# Patient Record
Sex: Female | Born: 1980 | Race: Black or African American | Hispanic: No | Marital: Married | Smoking: Never smoker
Health system: Southern US, Community
[De-identification: ages and names within clinical notes are randomized; demographics above are authoritative.]

## PROBLEM LIST (undated history)

## (undated) DIAGNOSIS — G971 Other reaction to spinal and lumbar puncture: Secondary | ICD-10-CM

## (undated) DIAGNOSIS — J45909 Unspecified asthma, uncomplicated: Secondary | ICD-10-CM

## (undated) HISTORY — PX: CERVICAL CERCLAGE: SHX1329

## (undated) HISTORY — PX: TONSILLECTOMY: SUR1361

## (undated) HISTORY — PX: LEEP: SHX91

---

## 2004-08-11 ENCOUNTER — Ambulatory Visit (HOSPITAL_COMMUNITY): Admission: RE | Admit: 2004-08-11 | Discharge: 2004-08-11 | Payer: Self-pay | Admitting: *Deleted

## 2005-04-17 ENCOUNTER — Inpatient Hospital Stay (HOSPITAL_COMMUNITY): Admission: AD | Admit: 2005-04-17 | Discharge: 2005-04-17 | Payer: Self-pay | Admitting: Obstetrics and Gynecology

## 2005-12-16 IMAGING — RF DG HYSTEROGRAM
4 series · 4 of 4 positions shown · non-contrast
Comparison: none

CLINICAL DATA: Secondary infertility.  Previous miscarriages.  
 HYSTEROSALPINGOGRAM:
 Hysterosalpingogram was performed by Dr. Lorraine, and four fluoroscopic spot images were submitted for interpretation.  
 The endometrial cavity of the uterus is unremarkable in appearance.  Contrast opacification of both fallopian tubes is seen and both tubes are normal in appearance.  Intraperitoneal spill of contrast from both tubes is seen.

[Series 1: run · 1 of 1 slices shown (1 of 4)]
[im 1/1]
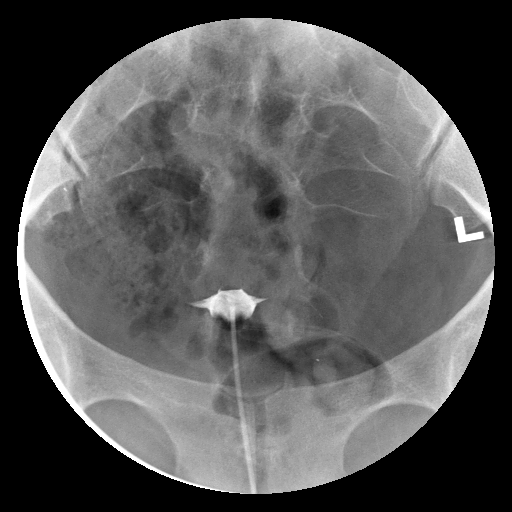

[Series 2: run · 1 of 1 slices shown (2 of 4)]
[im 1/1]
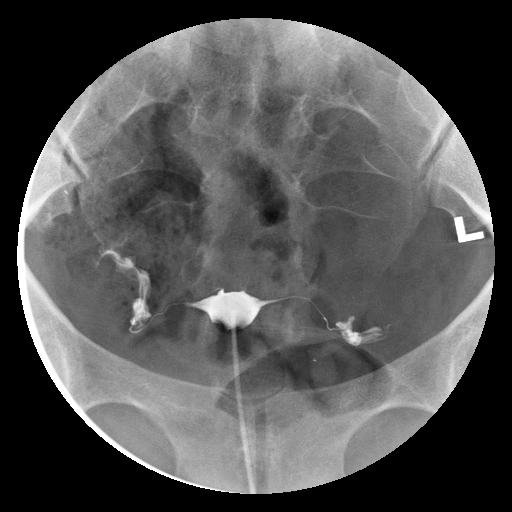

[Series 3: run · 1 of 1 slices shown (3 of 4)]
[im 1/1]
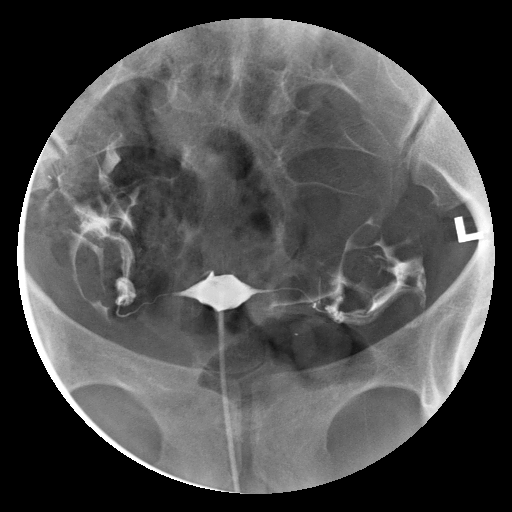

[Series 4: run · 1 of 1 slices shown (4 of 4)]
[im 1/1]
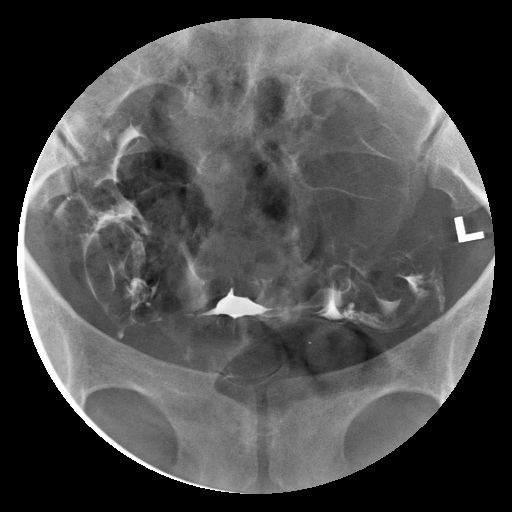

[4 of 4 positions shown; findings below may reference images not displayed]

IMPRESSION: Both fallopian tubes are patent.  Unremarkable appearance of the endometrial cavity.

## 2006-05-24 ENCOUNTER — Other Ambulatory Visit: Admission: RE | Admit: 2006-05-24 | Discharge: 2006-05-24 | Payer: Self-pay | Admitting: Obstetrics and Gynecology

## 2006-06-03 ENCOUNTER — Inpatient Hospital Stay (HOSPITAL_COMMUNITY): Admission: AD | Admit: 2006-06-03 | Discharge: 2006-06-03 | Payer: Self-pay | Admitting: Obstetrics and Gynecology

## 2006-06-06 ENCOUNTER — Ambulatory Visit (HOSPITAL_COMMUNITY): Admission: RE | Admit: 2006-06-06 | Discharge: 2006-06-06 | Payer: Self-pay | Admitting: Obstetrics and Gynecology

## 2006-06-06 ENCOUNTER — Encounter (INDEPENDENT_AMBULATORY_CARE_PROVIDER_SITE_OTHER): Payer: Self-pay | Admitting: *Deleted

## 2006-08-22 IMAGING — US US OB TRANSVAGINAL MODIFY
1 series · 13 of 28 positions shown · non-contrast
Comparison: None.

CLINICAL INFORMATION: 24-year-old G2 P0 AB1, LMP 02/07/2005, presenting with
spotting and cramping.

EARLY OBSTETRICAL ULTRASOUND (LESS THAN 14 WEEKS) INCLUDING TRANSVAGINAL
EXAMINATION  04/17/2005:

[Series 1: us ob transvaginal modify · 0.29mm/px · 13 of 31 slices shown]
[im 2/31]
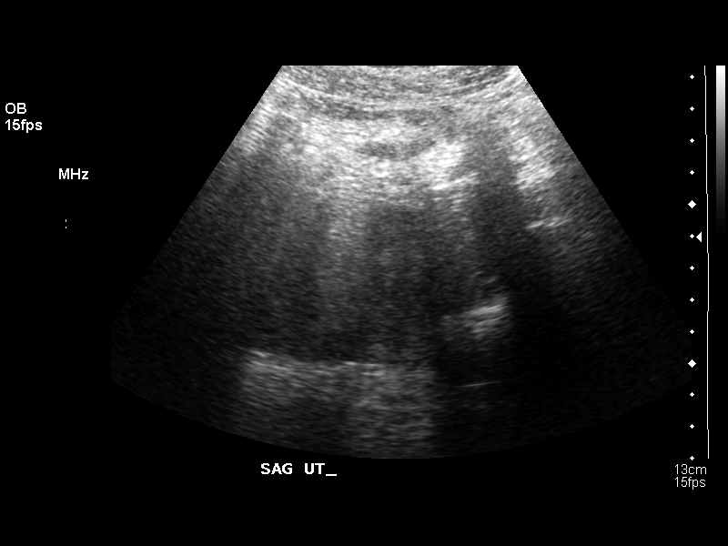
[im 4/31]
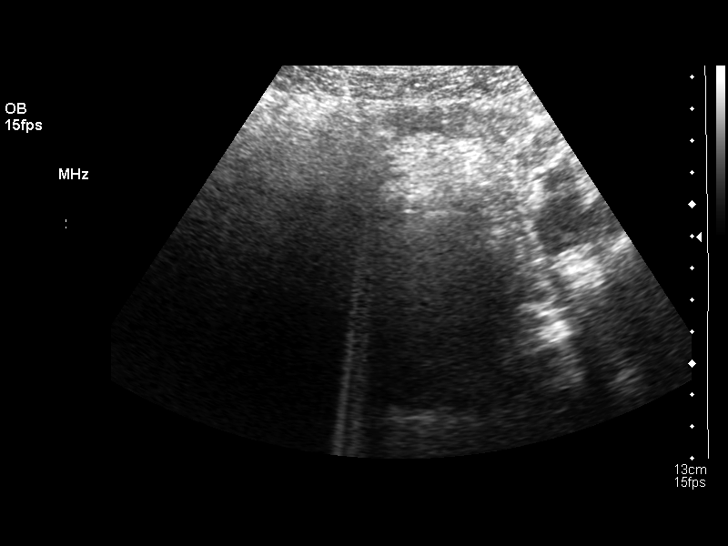
[im 6/31]
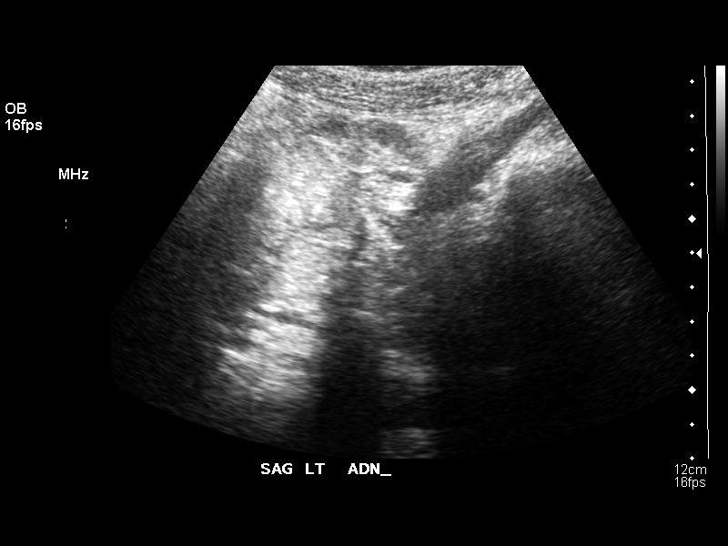
[im 8/31]
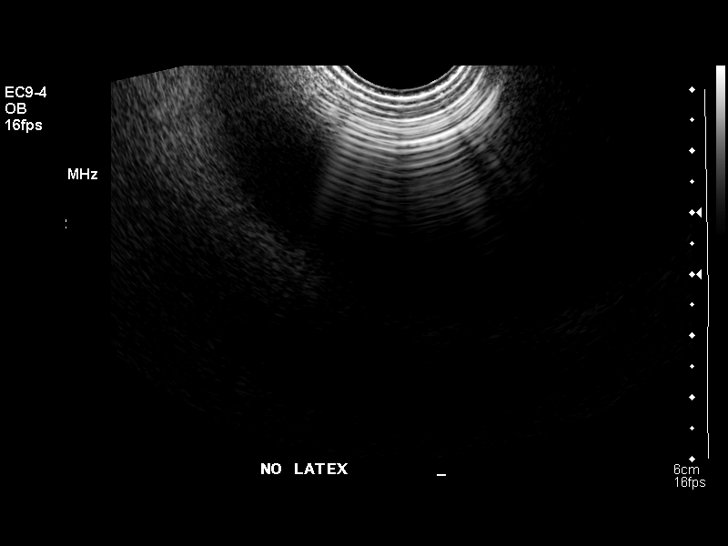
[im 11/31]
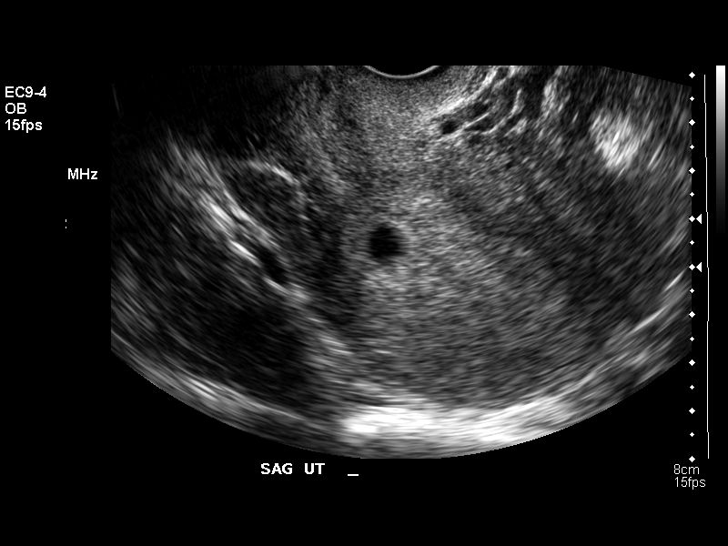
[im 13/31]
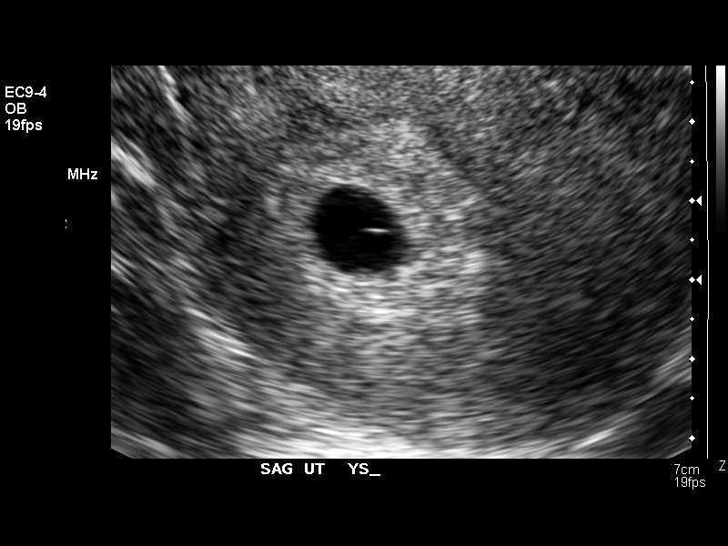
[im 16/31]
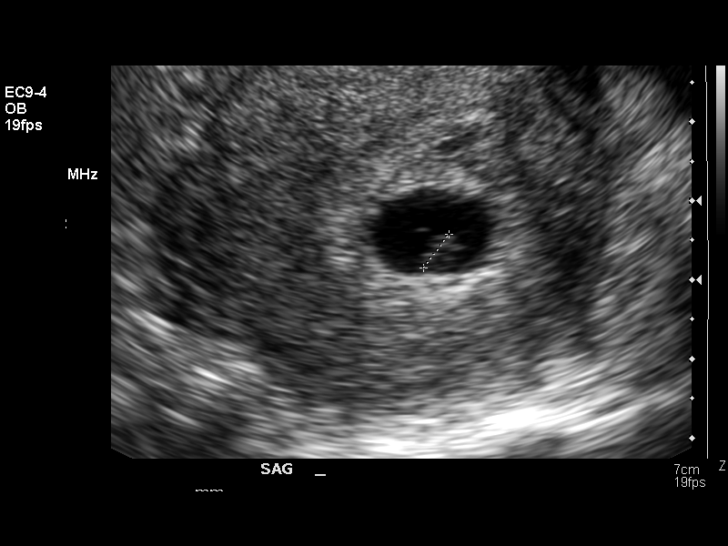
[im 18/31]
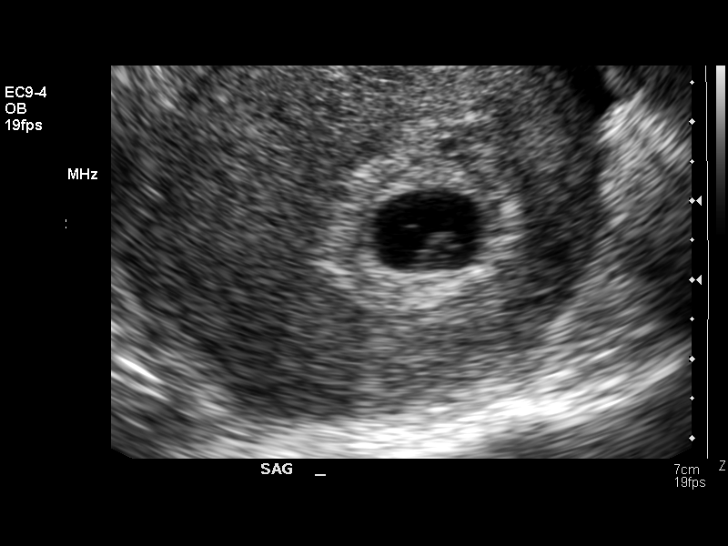
[im 21/31]
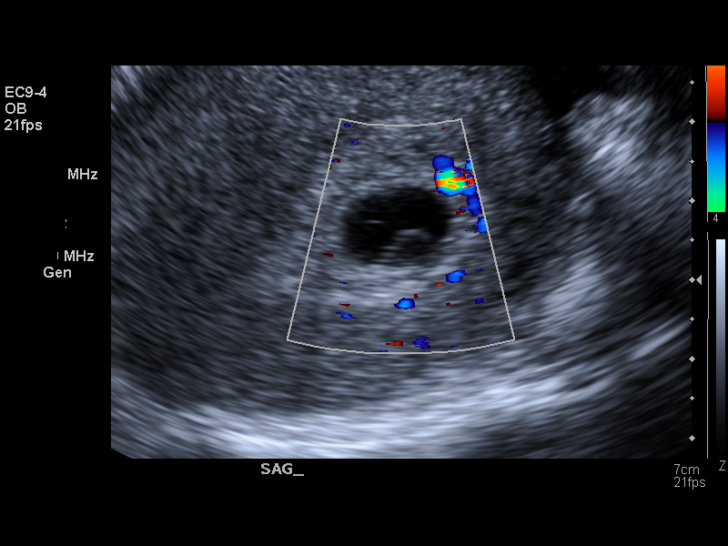
[im 23/31]
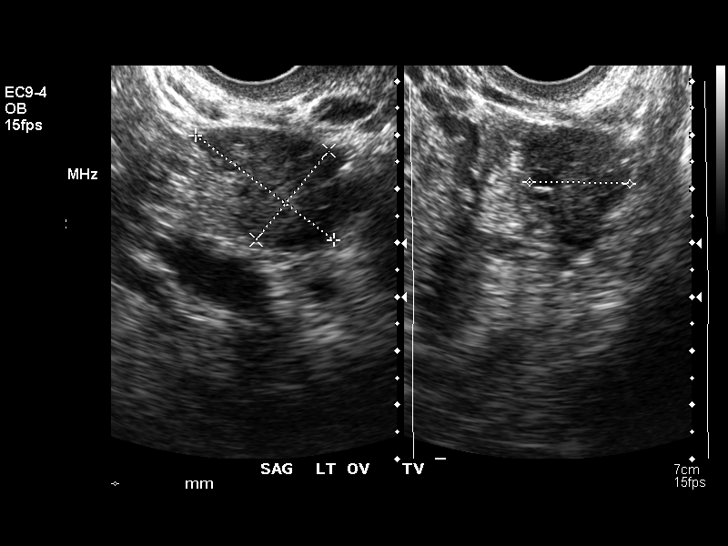
[im 25/31]
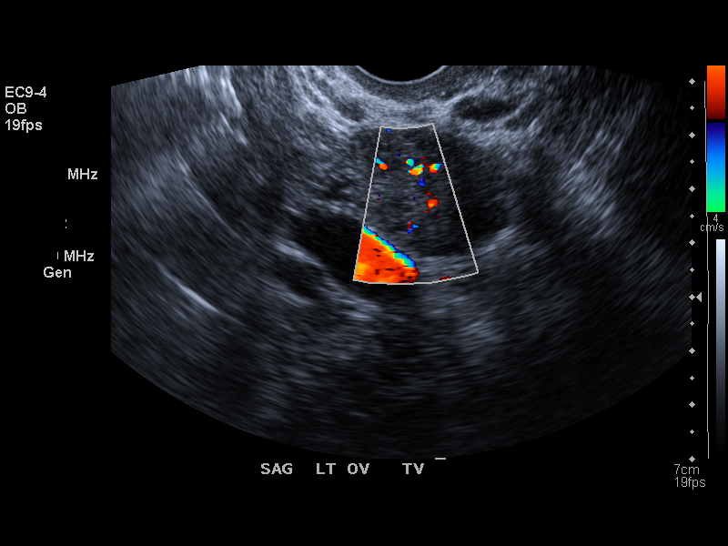
[im 27/31]
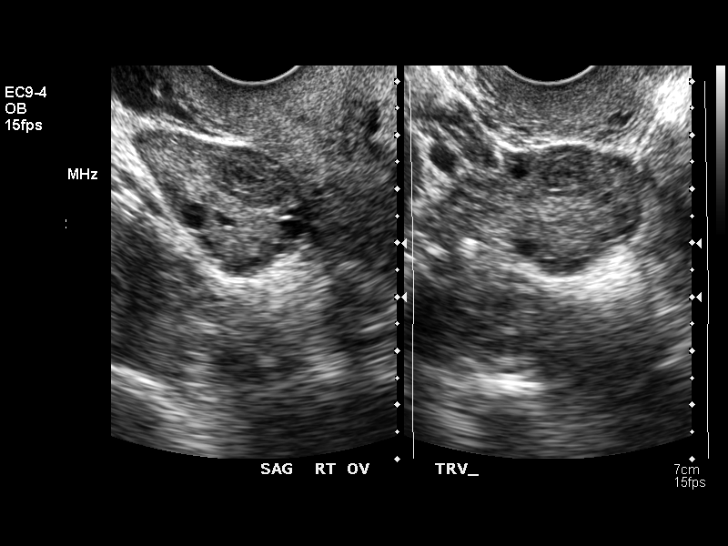
[im 29/31]
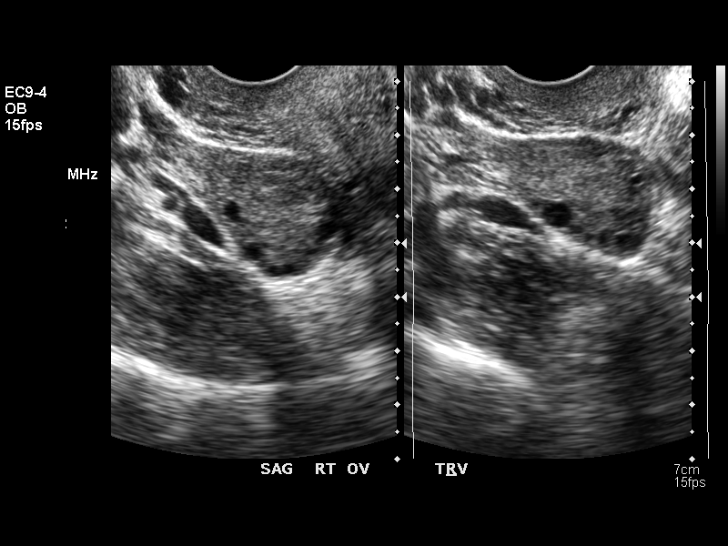

[13 of 28 positions shown; findings below may reference images not displayed]

By report, the patient had an ultrasound 10 days ago at
which time the embryo measured approximately 6 weeks and had a heart rate of
only 50.
FINDINGS: There is an intrauterine embryo with a crown-rump length of 5.3 mm,
corresponding to a gestational age of 6 weeks 2 days. This is far behind the
estimated gestational age by LMP of 9 weeks 6 days. A yolk sac is present.
Embryonic cardiac activity was not identified at real time examination. No
detectable heartbeat was seen at color Doppler evaluation. There is no evidence
of subchorionic hemorrhage.

Both ovaries are normal in size and appearance, containing small follicles. The
right ovary measures 3.0 x 3.6 x 2.2 cm. The left ovary measures 1.9 x 3.2 x
cm. There are no adnexal masses. There is no free fluid.
IMPRESSION: 1. Single intrauterine embryo which does not appear viable, as no cardiac
activity was identified. Crown-rump length measured 5.3 mm, corresponding to a
gestational age of 6 weeks 2 days, far behind the gestational age by LMP of 9
weeks 6 days. (One would be expected to identify cardiac activity once the
embryo reaches a crown-rump length of 5 mm).
2. Normal appearing ovaries. No adnexal masses or free fluid.
3. No evidence of subchorionic hemorrhage.

## 2007-05-03 ENCOUNTER — Ambulatory Visit (HOSPITAL_COMMUNITY): Admission: RE | Admit: 2007-05-03 | Discharge: 2007-05-03 | Payer: Self-pay | Admitting: Obstetrics and Gynecology

## 2007-06-04 ENCOUNTER — Inpatient Hospital Stay (HOSPITAL_COMMUNITY): Admission: AD | Admit: 2007-06-04 | Discharge: 2007-06-04 | Payer: Self-pay | Admitting: Obstetrics and Gynecology

## 2007-06-05 ENCOUNTER — Inpatient Hospital Stay (HOSPITAL_COMMUNITY): Admission: AD | Admit: 2007-06-05 | Discharge: 2007-06-05 | Payer: Self-pay | Admitting: Obstetrics and Gynecology

## 2007-09-14 ENCOUNTER — Inpatient Hospital Stay (HOSPITAL_COMMUNITY): Admission: AD | Admit: 2007-09-14 | Discharge: 2007-09-14 | Payer: Self-pay | Admitting: Obstetrics and Gynecology

## 2007-09-14 ENCOUNTER — Inpatient Hospital Stay (HOSPITAL_COMMUNITY): Admission: AD | Admit: 2007-09-14 | Discharge: 2007-09-17 | Payer: Self-pay | Admitting: Obstetrics and Gynecology

## 2007-10-08 IMAGING — US US OB TRANSVAGINAL MODIFY
1 series · 14 of 28 positions shown · non-contrast
Comparison: none

CLINICAL DATA: 25-year-old female with positive pregnancy test with pelvic pain, spotting, and cramping.  Estimated gestation of 7 weeks by LMP.  
OBSTETRICAL ULTRASOUND <14 WKS AND TRANSVAGINAL OB US:
TECHNIQUE: Both transabdominal and transvaginal ultrasound examinations were performed for complete evaluation of the gestation as well as the maternal uterus, adnexal regions, and pelvic cul-de-sac.

[Series 1: us ob transvaginal modify · 0.24mm/px · 14 of 43 slices shown]
[im 2/43]
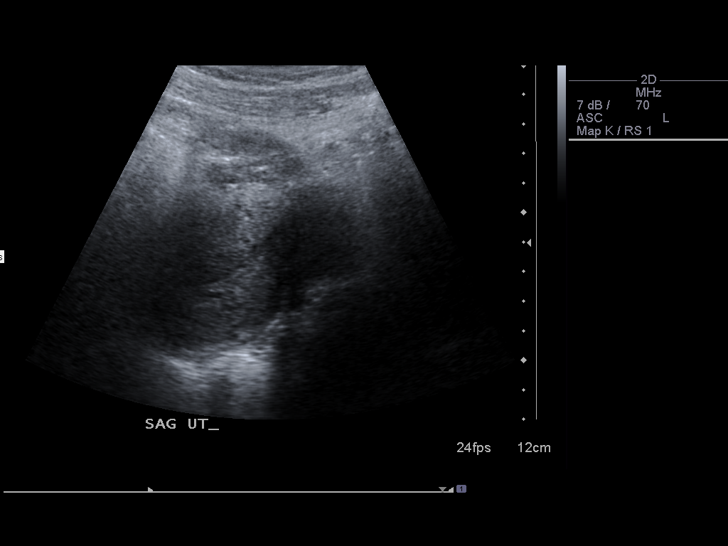
[im 5/43]
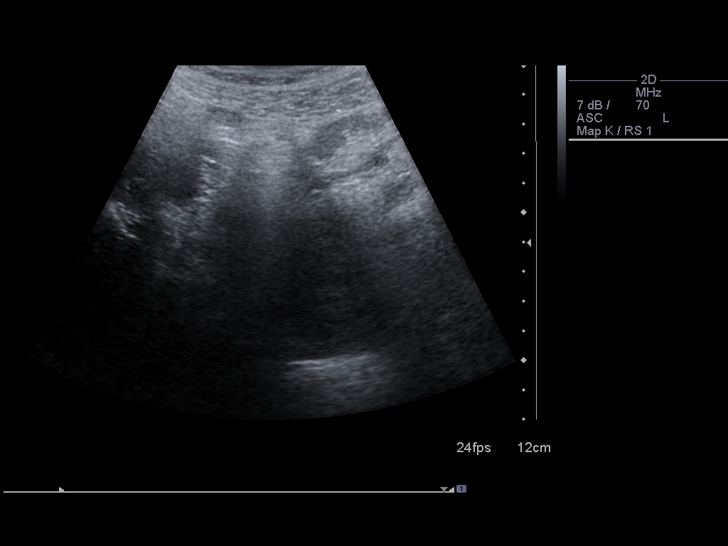
[im 8/43]
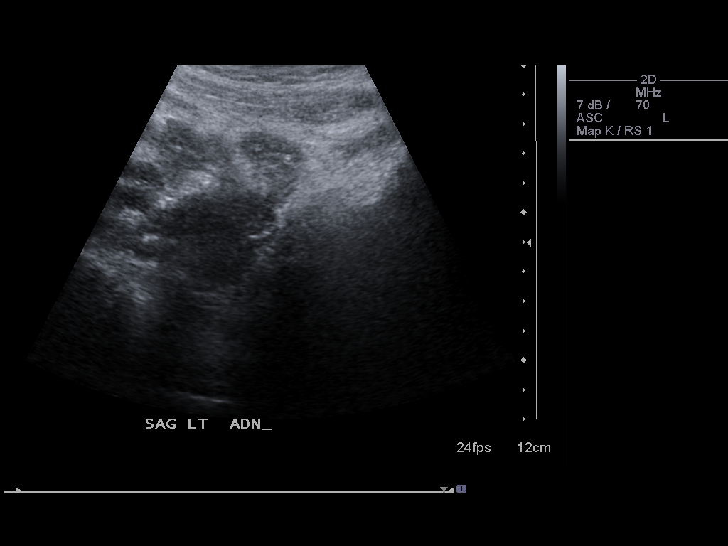
[im 11/43]
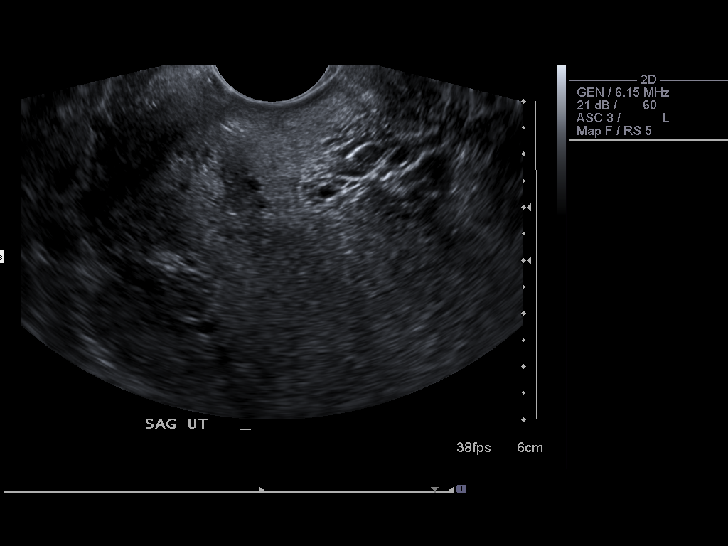
[im 15/43]
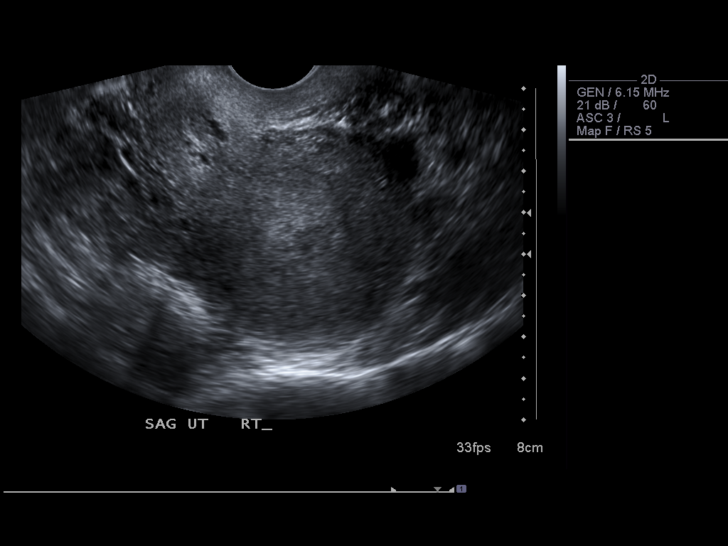
[im 18/43]
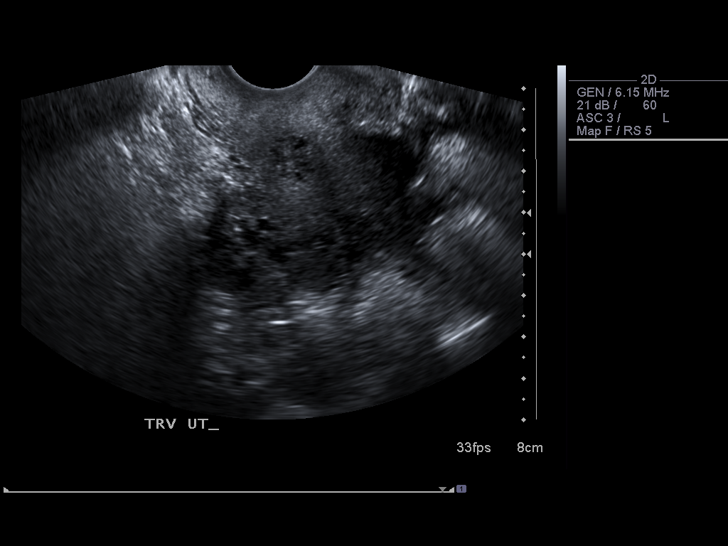
[im 21/43]
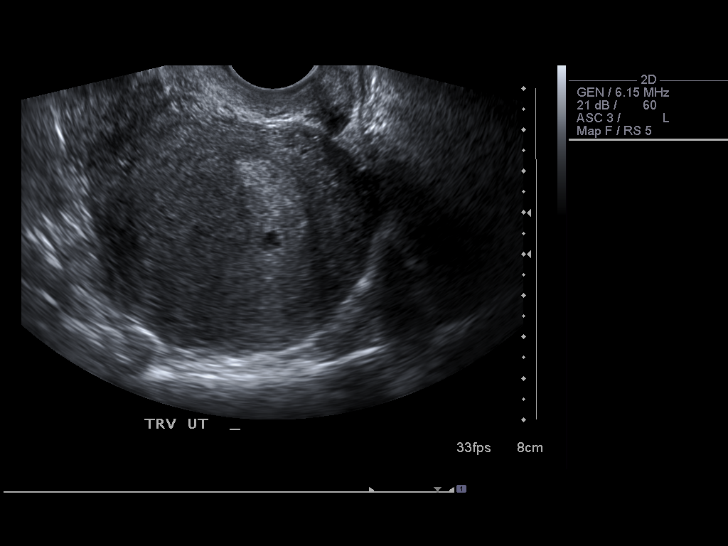
[im 24/43]
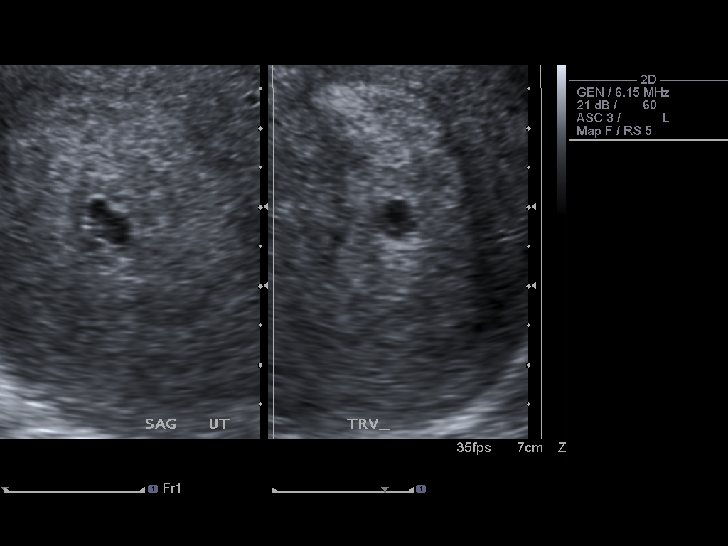
[im 27/43]
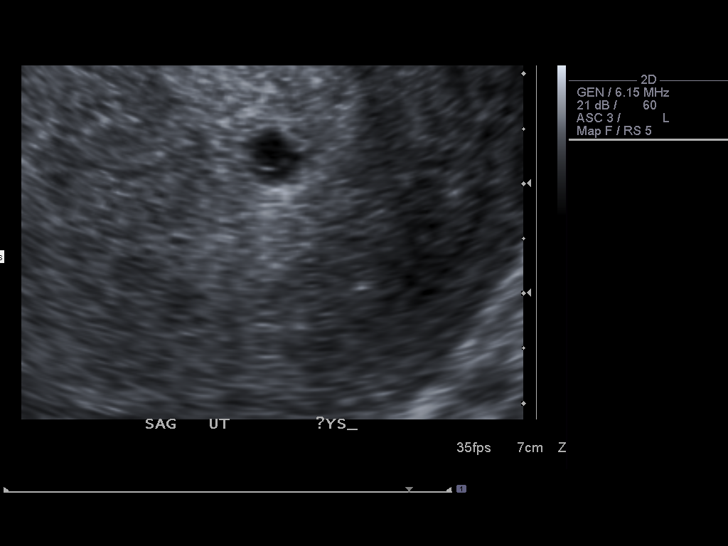
[im 30/43]
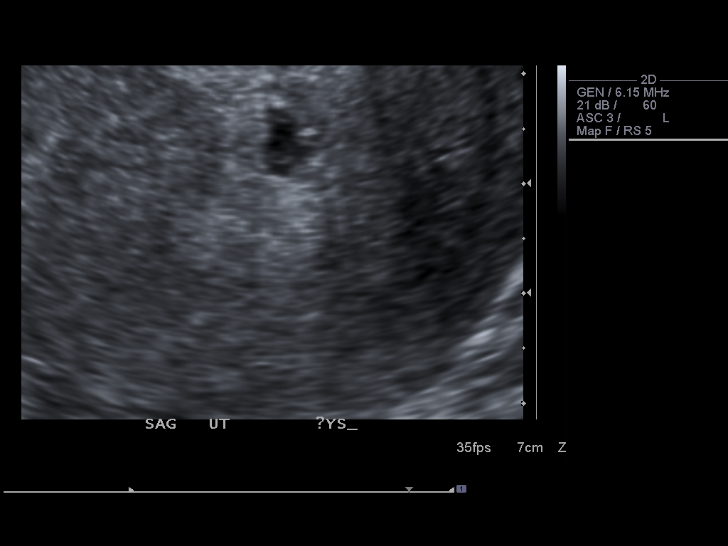
[im 33/43]
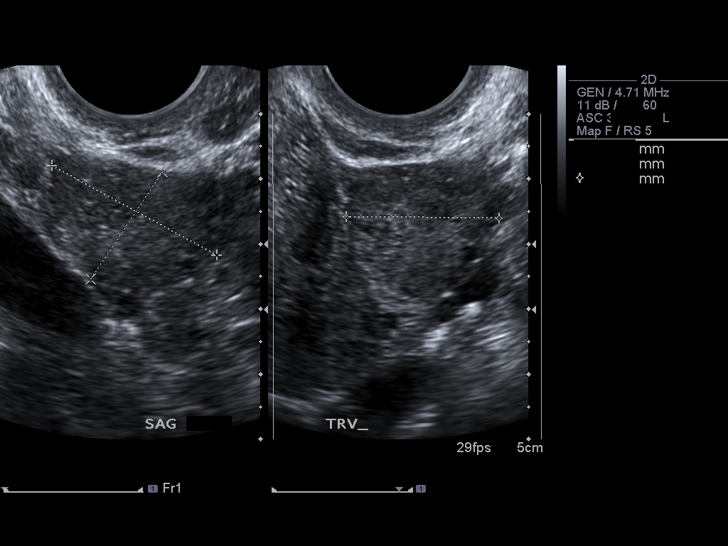
[im 36/43]
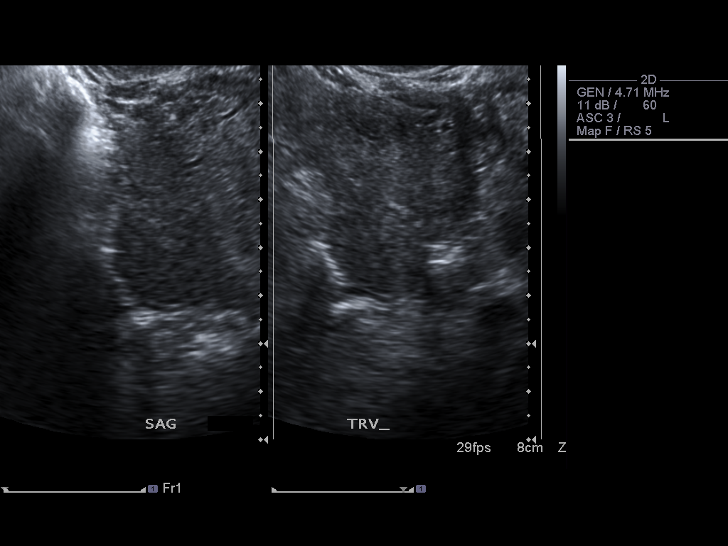
[im 39/43]
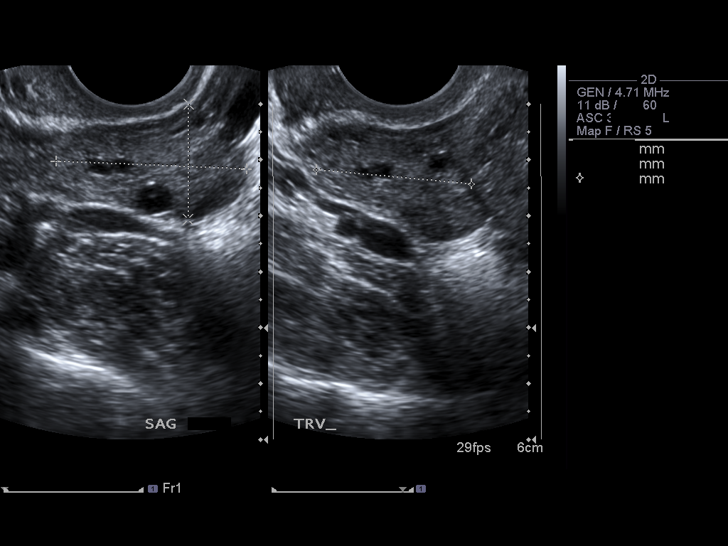
[im 43/43]
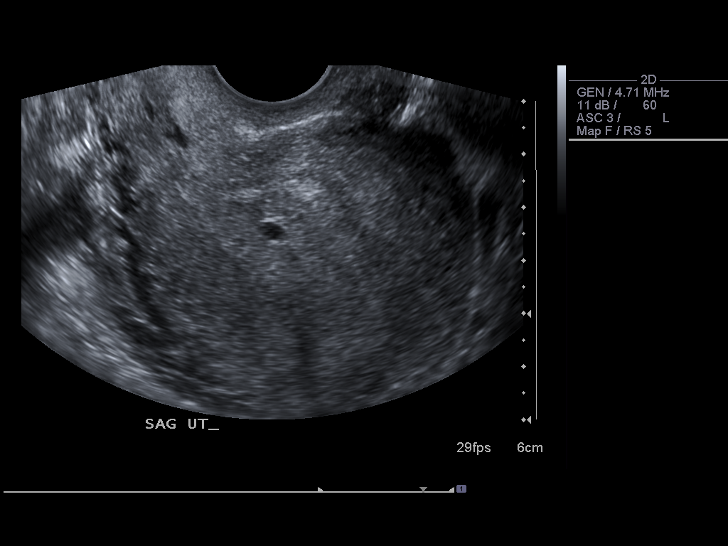

[14 of 28 positions shown; findings below may reference images not displayed]

FINDINGS: There is a single intrauterine gestational sac, which is slightly irregular with a yolk sac present.  Tiger sac is abnormally large for the size of the gestational sac.  There is no evidence of fetal pole.  Little decidual reaction is identified.  Mean sac diameter measures 5.4 mm, corresponding to a gestational age of 5 weeks 2 days.  There is no evidence of subchorionic hemorrhage.  The ovaries bilaterally are unremarkable.  Trace amount of free fluid is identified.
IMPRESSION: Single intrauterine gestational sac with yolk sac.  However, irregular shape of gestational sac, poor decidual reaction, and abnormally large yolk sac in relation to gestational sac size suggests failed first trimester pregnancy.  Follow-up is recommended.

## 2010-06-20 DIAGNOSIS — G971 Other reaction to spinal and lumbar puncture: Secondary | ICD-10-CM

## 2010-06-20 HISTORY — DX: Other reaction to spinal and lumbar puncture: G97.1

## 2010-06-23 ENCOUNTER — Ambulatory Visit (HOSPITAL_COMMUNITY)
Admission: RE | Admit: 2010-06-23 | Discharge: 2010-06-23 | Payer: Self-pay | Source: Home / Self Care | Attending: Obstetrics and Gynecology | Admitting: Obstetrics and Gynecology

## 2010-08-30 LAB — CBC
HCT: 35.2 % — ABNORMAL LOW (ref 36.0–46.0)
MCHC: 34.1 g/dL (ref 30.0–36.0)
MCV: 95.1 fL (ref 78.0–100.0)
RDW: 12 % (ref 11.5–15.5)
WBC: 11.2 10*3/uL — ABNORMAL HIGH (ref 4.0–10.5)

## 2010-10-21 ENCOUNTER — Inpatient Hospital Stay (HOSPITAL_COMMUNITY)
Admission: AD | Admit: 2010-10-21 | Discharge: 2010-10-21 | Disposition: A | Discharge status: Home / Self Care | Payer: Medicaid Other | Source: Ambulatory Visit | Attending: Obstetrics and Gynecology | Admitting: Obstetrics and Gynecology

## 2010-10-21 DIAGNOSIS — O4700 False labor before 37 completed weeks of gestation, unspecified trimester: Secondary | ICD-10-CM

## 2010-10-21 LAB — URINALYSIS, ROUTINE W REFLEX MICROSCOPIC
Bilirubin Urine: NEGATIVE
Hgb urine dipstick: NEGATIVE
Nitrite: NEGATIVE
Specific Gravity, Urine: <1.005 — ABNORMAL LOW (ref 1.005–1.030)
Urobilinogen, UA: 0.2 mg/dL (ref 0.0–1.0)
pH: 6 (ref 5.0–8.0)

## 2010-11-02 NOTE — H&P (Signed)
Martha Hubbard, Martha Hubbard                ACCOUNT NO.:  [id number]   MEDICAL RECORD NO.:  [id number]          PATIENT TYPE:  AMB   LOCATION:  SDC                           [facility]ILITY:  [facility]   PHYSICIAN:  Zenaida Niece, M.D.DATE OF BIRTH:  1981/04/28   DATE OF ADMISSION:  05/03/2007  DATE OF DISCHARGE:                              HISTORY & PHYSICAL   CHIEF COMPLAINT:  Intrauterine pregnancy at 19 weeks with a short  cervix.   HISTORY OF PRESENT ILLNESS:  This is a 30 year old black female, gravida  4, para 0, 1-2-0, with an EGA of [redacted] weeks by a seven week ultrasound  with a due date of April 4th, who was seen in the office on November  10th for her 19-week anatomy ultrasound.  Fetal anatomy was normal  except for an echogenic intra-cardiac focus, but her cervix was noted to  be approximately 1 cm long.  The situation was discussed with the  patient, that she may have an incompetent cervix, and she was given  options of bedrest or proceeding with a cerclage.  The risks of cerclage  have been discussed.  She has been receiving 17 progesterone injections  since 17 weeks due to a significantly pre-term delivery.  After  discussing options, she wishes to proceed with cervical cerclage and is  being admitted for this at this time.   PAST OB HISTORY:  In 2001, she had a vaginal delivery at 20 weeks of a  baby that weighed 10 ounces.  She had pre-term rupture of membranes  rapidly followed by delivery.  She also has two spontaneous abortions at  8 and 9 weeks with one D&C.   PAST GYN HISTORY:  She has a history of a LEEP in 1996 with normal  follow-up Pap smears.  She also has a history of Chlamydia and herpes.   PAST MEDICAL HISTORY:  History of asthma.   PAST SURGICAL HISTORY:  Tonsillectomy, LEEP, and D&C.   ALLERGIES:  None known.   CURRENT MEDICATIONS:  Seventeen progesterone shots weekly.   SOCIAL HISTORY:  She is single, but the father of the baby is involved.  She denies alcohol,  tobacco, or drug use.   FAMILY HISTORY:  Noncontributory.   REVIEW OF SYSTEMS:  Negative.   PHYSICAL EXAMINATION:  This is a gravid female in no acute distress.  Weight is 157 pounds.  NECK:  Supple without lymphadenopathy or thyromegaly.  LUNGS:  Clear to auscultation.  HEART:  Regular rate and rhythm without murmur.  ABDOMEN:  Gravid with a fundus below the umbilicus and positive fetal  heart tones.  EXTREMITIES:  No edema.  Nontender.  PELVIC:  Cervix is closed, approximately 1 cm in length and -3 station  on the presenting part.   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  She is already receiving 17 progesterone shots due  to a previous delivery at 20 weeks.  Options of bedrest and cerclage  have been discussed with the patient.  Risks of cerclage, including  rupturing membranes, pre-term labor, and introducing infection, all  of  these potentially causing her to lose the pregnancy have been discussed.  The patient understands the risks and wishes to proceed.   The plan is to admit the patient on November 13th for a cervical  cerclage.  She has been put on bedrest in the meantime.      Zenaida Niece, M.D.  Electronically Signed     TDM/MEDQ  D:  05/02/2007  T:  05/02/2007  Job:  [id number]

## 2010-11-02 NOTE — Discharge Summary (Signed)
Martha Hubbard, Martha Hubbard                ACCOUNT NO.:  [id number]   MEDICAL RECORD NO.:  [id number]          PATIENT TYPE:  INP   LOCATION:  9112                          [facility]ILITY:  [facility]   PHYSICIAN:  Malachi Pro. Ambrose Mantle, M.D. DATE OF BIRTH:  04/12/1981   DATE OF ADMISSION:  09/15/2007  DATE OF DISCHARGE:  09/17/2007                               DISCHARGE SUMMARY   HISTORY:  A 30 year old black single female para 0-1-2-0, gravida 4, EDC  September 22, 2007, admitted in early labor.  Blood group and type A  positive.  Negative antibody sickle cell,  negative RPR, nonreactive  Rubella equivocal.  Hepatitis B surface antigen negative.  HIV negative.  GC and Chlamydia negative.  First trimester screen negative.  Group B  strep positive.  AFB negative.  1-hour Glucola 82.  Vaginal ultrasound  on February 22, 2007, 7 weeks 5 days, [facility] September 22, 2007.  The patient  was seen at [redacted] weeks gestation.  She began 17-hydroxy progesterone at 17  weeks.  At 20 weeks, the patient was noted to have a short cervix and  proceeded to cerclage on May 03, 2007.  Ultrasound on April 30, 2007 showed an average gestational age of [redacted] weeks and 1 day with an [facility]  of September 23, 2007.  The cervix was 0.89 cm .  Prenatal care was otherwise  uncomplicated on September 03, 2007.  Nonstress test began for small size  less than dates, ultrasound results were not available.  The cerclage  was removed on August 27, 2007.  The patient came to maternity admission  on September 14, 2007, and was evaluated for several hours with no cervical  progress.  She returned at 10 p.m. on September 14, 2007, and was that to be  6 cm dilated.  On arrival in labor and delivery, the nurse there called  her a 4 cm.  She made progress to full dilatation with an intact bag of  water.   ALLERGIES:  Revealed no known allergies to drugs, but she was allergic  to BANANAS, WALNUTS, and MELONS.   ILLNESSES:  Anemia and asthma.   OPERATIONS:  LEEP in 1996, D&C in  2007, and T&A in 1995.   SOCIAL HISTORY:  Alcohol, tobacco, and drugs none.   FAMILY HISTORY:  History of diabetes and heart disease.   OBSTETRIC HISTORY:  In March 2001, she delivered a 1 pound 2 ounce female  at [redacted] weeks gestation after preterm premature rupture of the membranes.  In October 2006, she had a spontaneous abortion.  In December 2007, a  spontaneous abortion with the D&C.   PHYSICAL EXAMINATION:  VITAL SIGNS:  On admission revealed normal.  HEART:  Normal.  LUNGS:  Normal.  ABDOMEN:  Soft.  GU:  Fundal height had been 34 cm on September 03, 2007.  Fetal heart tones  were normal.  The cervix was 10 cm.  Bag of water was intact.  Artificial rupture of the membranes produced clear fluid.   IMPRESSIONS:  When I saw her for intrauterine pregnancy at 39 weeks  second stage  of labor, history of incompetent cervix, status post LEEP  procedure, and positive GBS.  The patient was continued on penicillin.  She pushed well but made very slow distant with the vertex OP.  She  finally delivered spontaneously.  OP over a second-degree midline  laceration by Dr. Ambrose Mantle, a living female infant 6 pounds 7 ounce, Apgar's  of 8 at 1 and 9 at 5 minutes.  Placenta was intact.  Uterus was normal.  Small fibroid on the anterior cervix of the uterus was approximately 2  cm.  Blood loss about 400 mL.  Second-degree midline laceration repaired  with 3-0 Vicryl.  Postpartum, the patient did well and was discharged on  the second postpartum day.   LABORATORY DATA:  RPR was nonreactive.  Hemoglobin on admission 12.7,  hematocrit 36.3, white count 14,900 and platelet count 166,000.  Followup hemoglobin at 11.7 and platelet count 144,000.   FINAL DIAGNOSIS:  Intrauterine pregnancy at 39 weeks delivered OP.   OPERATIONS:  Spontaneous delivery OP.  Occult cord prolapsed.   FINAL CONDITION:  Improved.   INSTRUCTIONS:  Include a regular discharge instruction booklet.  Motrin  600 mg 30 tablets one q.6  h. as needed for pain and 1 refill.  The  patient is given a prescription for Motrin and is asked to return to the  office in 6 weeks for follow up examination.      Malachi Pro. Ambrose Mantle, M.D.  Electronically Signed     TFH/MEDQ  D:  09/17/2007  T:  09/18/2007  Job:  [id number]

## 2010-11-02 NOTE — Op Note (Signed)
Martha Hubbard, KOMAR                ACCOUNT NO.:  [id number]   MEDICAL RECORD NO.:  [id number]          PATIENT TYPE:  AMB   LOCATION:  SDC                           [facility]   PHYSICIAN:  Zenaida Niece, M.D.DATE OF BIRTH:  1981/05/28   DATE OF PROCEDURE:  05/03/2007  DATE OF DISCHARGE:                               OPERATIVE REPORT   PREOPERATIVE DIAGNOSIS:  Intrauterine pregnancy at 19 weeks with an  incompetent cervix.   POSTOPERATIVE DIAGNOSIS:  Intrauterine pregnancy at 19 weeks with an  incompetent cervix.   PROCEDURE:  McDonald cervical cerclage.   SURGEON:  Zenaida Niece, M.D.   ANESTHESIA:  Spinal.   FINDINGS:  Cervix was approximately 1 cm dilated with membranes at the  os and was approximately 1 cm long.   SPECIMENS:  None.   ESTIMATED BLOOD LOSS:  Minimal.   COMPLICATIONS:  None.   PROCEDURE IN DETAIL:  The patient was taken to the operating room and  placed in the sitting position.  Dr. Tacy Dura instilled spinal anesthesia  and she was then placed in the dorsal supine position and placed in  mobile stirrups.  Perineum was then prepped and draped in the usual  sterile fashion and bladder drained with a latex free catheter.  The  level of her anesthesia at this point was found to be adequate.  A  weighted speculum was inserted into the vagina and Deaver retractors  used anteriorly and laterally.  The vagina and cervix were washed with  warm saline solution.  Again, at this point the cervix was noted be 1 cm  dilated with membranes at the os.  An open ringed forceps was used to  grasp the cervix at 12 o'clock careful to avoid the membranes.  A suture  of #1 Prolene was placed in a counterclockwise direction starting at 11  o'clock to 9 o'clock, 9 o'clock to 7 o'clock, 5 o'clock to 3 o'clock and  3 o'clock back up to 1 o'clock.  The suture was tied anteriorly while  reducing the membranes and this achieved good cervical closure and  maintained about 1  cm of cervical length.  The cervix and vagina were  then again copiously irrigated with warm saline and no bleeding was  noted.  The suture appeared intact.  All instruments were then removed  from the vagina.  At this point the cervix was inspected and found to be  closed with about 1 cm of  length.  The patient was taken down from stirrups.  She was taken to the  recovery room in stable condition after tolerating the procedure well.  Counts were correct and she received Ancef 1 gram IV prior to the  procedure.  She will be discharged home on bed rest.      Zenaida Niece, M.D.  Electronically Signed     TDM/MEDQ  D:  05/03/2007  T:  05/04/2007  Job:  [id number]

## 2010-11-05 NOTE — H&P (Signed)
NAMEKEM, PARCHER                ACCOUNT NO.:  [id number]   MEDICAL RECORD NO.:  [id number]          PATIENT TYPE:  AMB   LOCATION:  SDC                           [facility]   PHYSICIAN:  James A. Ashley Royalty, M.D.DATE OF BIRTH:  1981/06/18   DATE OF ADMISSION:  DATE OF DISCHARGE:                              HISTORY & PHYSICAL   This is a 30 year old, gravida 3, para 0-0-2-0, last menstrual period  April 12, 2006, placing her at or about 7 weeks, 5 days gestation  currently.  The patient presented to [facility] on June 03, 2006, complaining of bleeding and cramping.  She was evaluated by [facility], and an ultrasound was performed.  The  ultrasound revealed a single intrauterine gestational sac.  The shape of  the sac was irregular, and there was an abnormally large yolk sac  present relative to the size of the gestational sac.  These findings  strongly suggested a nonviable pregnancy.  Quantitative beta HCG was  obtained on the same day and the value was 1192 mIU per ml.  As the  patient strongly desires the pregnancy, the decision was made to have  her come back today or tomorrow for followup.  The patient presented  today complaining of continued spotting.  She denies any definite  passage of tissue.  Pelvic shows the genitalia within normal limits.  The vagina and cervix are without gross lesions.  The os is closed.  Bimanual examination revealed the uterus to be approximately 8 to 9  weeks size, and no adnexal masses are palpable.   HCG was repeated today and the value was 649 mIU per ml.   IMPRESSION:  1. Inevitable abortion at approximately 7 weeks, 5 days gestation.  2. Inevitable abortion.   PLAN:  Suction dilatation and curettage.  Risks, benefits,  complications, and alternatives fully discussed with the patient.  She  states she understands and accepts.  Questions invited and answered.      James A. Ashley Royalty, M.D.  Electronically Signed     JAM/MEDQ  D:  06/05/2006  T:  06/05/2006  Job:  [id number]

## 2010-11-05 NOTE — Op Note (Signed)
Martha Hubbard, KOOP                ACCOUNT NO.:  [id number]   MEDICAL RECORD NO.:  [id number]          PATIENT TYPE:  AMB   LOCATION:  SDC                           [facility]   PHYSICIAN:  James A. Ashley Royalty, M.D.DATE OF BIRTH:  1980-10-10   DATE OF PROCEDURE:  06/06/2006  DATE OF DISCHARGE:                               OPERATIVE REPORT   PREOPERATIVE DIAGNOSIS:  Inevitable abortion at approximately [redacted] weeks  gestation.   POSTOPERATIVE DIAGNOSIS:  Inevitable abortion at approximately [redacted] weeks  gestation.   PATHOLOGY:  Pending.   PROCEDURE:  Suction dilatation and curettage.   SURGEON:  Rudy Jew. Ashley Royalty, M.D.   ANESTHESIA:  Monitored anesthesia care with 1% Xylocaine paracervical  block (20 mL).   FINDINGS:  The uterus sounded to approximately 9 cm.   ESTIMATED BLOOD LOSS:  Less than 50 mL.   COMPLICATIONS:  None.   PACKS AND DRAINS:  None.   DESCRIPTION OF PROCEDURE:  The patient was taken to the operating room  and placed in the dorsal supine position.  After IV sedation was  administered, she was placed in the lithotomy position and prepped and  draped in the usual manner for vaginal surgery.   A posterior weighted retractor was placed per vagina.  The anterior lip  of the cervix was grasped with a single-tooth tenaculum.  20 mL of 1%  Xylocaine was instilled circumferentially in the cervix to create a  paracervical block.  The uterus was gently sounded to 9 cm and noted to  be slightly retroverted.  Pratt dilators were then introduced into the  cervix to insure dilatation of at least 27-French.  No dilatation was  actually required - the cervix was already dilated this amount.  Next, a  9-mm suction curette was introduced into the uterine cavity and suction  applied.  A moderate amount of products of conception was delivered  through the tubing.  After several passages with the suction curette, no  additional tissue was obtained.   At this point, the patient  was felt to have benefited maximally from the  surgical procedure.  The vaginal instruments were removed.  Hemostasis  was noted.  The procedure was terminated.   The patient was taken to the recovery room in excellent condition.  She  was Rh positive.      James A. Ashley Royalty, M.D.  Electronically Signed     JAM/MEDQ  D:  06/06/2006  T:  06/06/2006  Job:  [id number]

## 2010-12-17 ENCOUNTER — Inpatient Hospital Stay (HOSPITAL_COMMUNITY)
Admission: AD | Admit: 2010-12-17 | Discharge: 2010-12-19 | DRG: 775 | Disposition: A | Discharge status: Home / Self Care | Payer: Medicaid Other | Source: Ambulatory Visit | Attending: Obstetrics and Gynecology | Admitting: Obstetrics and Gynecology

## 2010-12-17 DIAGNOSIS — O3430 Maternal care for cervical incompetence, unspecified trimester: Secondary | ICD-10-CM | POA: Diagnosis present

## 2010-12-17 LAB — CBC
HCT: 36.4 % (ref 36.0–46.0)
MCH: 32.6 pg (ref 26.0–34.0)
MCV: 98.1 fL (ref 78.0–100.0)
Platelets: 168 10*3/uL (ref 150–400)
RBC: 3.71 MIL/uL — ABNORMAL LOW (ref 3.87–5.11)
RDW: 13.3 % (ref 11.5–15.5)
WBC: 12.5 10*3/uL — ABNORMAL HIGH (ref 4.0–10.5)

## 2010-12-17 LAB — RPR: RPR Ser Ql: NONREACTIVE

## 2010-12-23 NOTE — Discharge Summary (Signed)
  NAMEBOBETTE, LEYH                ACCOUNT NO.:  [id number]  MEDICAL RECORD NO.:  [id number]  LOCATION:  9114                          [facility]  PHYSICIAN:  Zenaida Niece, M.D.DATE OF BIRTH:  12/29/1980  DATE OF ADMISSION:  12/17/2010 DATE OF DISCHARGE:  12/19/2010                              DISCHARGE SUMMARY   ADMISSION DIAGNOSIS:  Intrauterine pregnancy at 40 weeks.  DISCHARGE DIAGNOSIS:  Intrauterine pregnancy at 40 weeks.  PROCEDURES:  On June 29, she had a spontaneous vaginal delivery.  HISTORY AND PHYSICAL:  This is a 30 year old gravida 5, para 1-0-3-1 with an EGA of [redacted] weeks who presents for elective induction.  Prenatal care complicated by an incompetent cervix for which she had a cerclage at 15 weeks which was removed at 36 weeks and she received weekly 17 progesterone.  Prenatal care otherwise uncomplicated.  PRENATAL LABS:  Blood type is A+ with a negative antibody screen, rubella immune, hepatitis B surface antigen negative, first trimester screen normal.  MSAFP normal.  GCT 88, group B strep is negative.  PAST OB HISTORY:  Vaginal delivery at term 6 pounds 7 ounces and she did have a cerclage without pregnancy.  Two spontaneous abortions and 1 early second trimester loss with PPROM.  PAST MEDICAL HISTORY:  Asthma, Chlamydia, HPV, and Trichomonas.  PAST SURGICAL HISTORY:  D&E, LEEP, and tonsillectomy.  PHYSICAL EXAMINATION:  VITAL SIGNS:  She is afebrile with stable vital signs.  Fetal heart tracing category 1 with irregular contractions on Pitocin. ABDOMEN:  Gravid, nontender with an estimated fetal weight of 7.5 pounds.  Cervix is 5, 70, -1 vertex presentation and membranes were ruptured revealing clear fluid.  HOSPITAL COURSE:  The patient was admitted for elective induction.  She was placed on Pitocin and then had membranes ruptured.  She progressed quickly to complete, pushed well, and on the evening of June 29 had a vaginal delivery of a  viable female infant with Apgar's of 7 and 8 that weighed 8 pounds 9 ounces.  Placenta delivered spontaneous was intact. She had a small second-degree laceration repaired with 3-0 Vicryl with local block and estimated blood loss was less than 500 mL.  Postpartum, she had no significant complications and on postpartum day #2 was stable for discharge home.  DISCHARGE INSTRUCTIONS:  Regular diet, pelvic rest, follow up in 6 weeks.  MEDICATIONS:  Over-the-counter ibuprofen as needed and she is given our discharge pamphlet.     Zenaida Niece, M.D.     TDM/MEDQ  D:  12/19/2010  T:  12/20/2010  Job:  [id number]  Electronically Signed by Lavina Hamman M.D. on 12/23/2010 09:08:47 PM

## 2011-03-14 LAB — CBC
HCT: 33.5 — ABNORMAL LOW
HCT: 36.3
Hemoglobin: 11.7 — ABNORMAL LOW
Hemoglobin: 12.7
MCHC: 34.9
MCHC: 34.9
MCV: 101.1 — ABNORMAL HIGH
MCV: 99.7
Platelets: 144 — ABNORMAL LOW
Platelets: 166
RBC: 3.31 — ABNORMAL LOW
RBC: 3.64 — ABNORMAL LOW
RDW: 12.7
RDW: 13.1
WBC: 14.7 — ABNORMAL HIGH
WBC: 14.9 — ABNORMAL HIGH

## 2011-03-14 LAB — CCBB MATERNAL DONOR DRAW

## 2011-03-14 LAB — RPR: RPR Ser Ql: NONREACTIVE

## 2011-03-29 LAB — CBC
HCT: 34.4 — ABNORMAL LOW
Hemoglobin: 11.9 — ABNORMAL LOW
MCHC: 34.6
MCV: 100.6 — ABNORMAL HIGH
Platelets: 188
RBC: 3.42 — ABNORMAL LOW
RDW: 12.4
WBC: 12.3 — ABNORMAL HIGH

## 2014-02-06 ENCOUNTER — Encounter (HOSPITAL_COMMUNITY): Payer: Self-pay | Admitting: *Deleted

## 2014-02-19 NOTE — H&P (Signed)
Martha Hubbard is an 33 y.o. female. She desires permanent sterility and is admitted for bilateral salpingectomy.  Pertinent Gynecological History: Last pap: abnormal: ASCUS with neg HPV Date: 2014 OB History: G5, P2122 Preterm loss at 20 weeks with PPROM 2 term deliveries using cervical cerclage SAB x 2   Menstrual History: Patient's last menstrual period was 01/20/2014.    Past Medical History  Diagnosis Date  . Asthma     rare emergency inhaler use  . Spinal headache 2012    s/p cerclage    Past Surgical History  Procedure Laterality Date  . Tonsillectomy    . Leep    . Cervical cerclage      History reviewed. No pertinent family history.  Social History:  reports that she has never smoked. She does not have any smokeless tobacco history on file. She reports that she does not drink alcohol or use illicit drugs.  Allergies: Allergies not on file  No prescriptions prior to admission    Review of Systems  Respiratory: Negative.   Cardiovascular: Negative.   Gastrointestinal: Negative.   Genitourinary: Negative.     Last menstrual period 01/20/2014. Physical Exam  Constitutional: She appears well-developed and well-nourished.  Neck: Neck supple. No thyromegaly present.  Cardiovascular: Normal rate, regular rhythm and normal heart sounds.   No murmur heard. Respiratory: Effort normal and breath sounds normal. No respiratory distress. She has no wheezes.  GI: Soft. She exhibits no distension and no mass. There is no tenderness.  Genitourinary: Vagina normal.  Uterus normal size, MP-RV No adnexal mass or tenderness    No results found for this or any previous visit (from the past 24 hour(s)).  No results found.  Assessment/Plan: Desires permanent sterility.  All options for contraception discussed, she is sure she does not want more children and wants permanent sterility.  Surgical options, procedure, risks, failure rates all discussed.  Will admit for  laparoscopic bilateral salpingectomy.  Louden Houseworth D 02/19/2014, 8:16 PM

## 2014-02-20 ENCOUNTER — Encounter (HOSPITAL_COMMUNITY): Payer: Medicaid Other | Admitting: Anesthesiology

## 2014-02-20 ENCOUNTER — Ambulatory Visit (HOSPITAL_COMMUNITY): Payer: Medicaid Other | Admitting: Anesthesiology

## 2014-02-20 ENCOUNTER — Encounter (HOSPITAL_COMMUNITY)
Admission: RE | Disposition: A | Discharge status: Home / Self Care | Payer: Self-pay | Source: Ambulatory Visit | Attending: Obstetrics and Gynecology

## 2014-02-20 ENCOUNTER — Encounter (HOSPITAL_COMMUNITY): Payer: Self-pay | Admitting: *Deleted

## 2014-02-20 ENCOUNTER — Ambulatory Visit (HOSPITAL_COMMUNITY)
Admission: RE | Admit: 2014-02-20 | Discharge: 2014-02-20 | Disposition: A | Discharge status: Home / Self Care | Payer: Medicaid Other | Source: Ambulatory Visit | Attending: Obstetrics and Gynecology | Admitting: Obstetrics and Gynecology

## 2014-02-20 DIAGNOSIS — Z302 Encounter for sterilization: Secondary | ICD-10-CM | POA: Diagnosis present

## 2014-02-20 DIAGNOSIS — Z5320 Procedure and treatment not carried out because of patient's decision for unspecified reasons: Secondary | ICD-10-CM | POA: Insufficient documentation

## 2014-02-20 HISTORY — DX: Other reaction to spinal and lumbar puncture: G97.1

## 2014-02-20 HISTORY — DX: Unspecified asthma, uncomplicated: J45.909

## 2014-02-20 LAB — PREGNANCY, URINE: PREG TEST UR: NEGATIVE

## 2014-02-20 LAB — CBC
HCT: 37.6 % (ref 36.0–46.0)
HEMOGLOBIN: 12.8 g/dL (ref 12.0–15.0)
MCH: 32.7 pg (ref 26.0–34.0)
MCHC: 34 g/dL (ref 30.0–36.0)
MCV: 95.9 fL (ref 78.0–100.0)
PLATELETS: 210 10*3/uL (ref 150–400)
RBC: 3.92 MIL/uL (ref 3.87–5.11)
RDW: 11.8 % (ref 11.5–15.5)
WBC: 7.3 10*3/uL (ref 4.0–10.5)

## 2014-02-20 SURGERY — SALPINGECTOMY, BILATERAL, LAPAROSCOPIC
Anesthesia: General | Laterality: Bilateral

## 2014-02-20 MED ORDER — DEXAMETHASONE SODIUM PHOSPHATE 10 MG/ML IJ SOLN
INTRAMUSCULAR | Status: AC
  Filled 2014-02-20: qty 1

## 2014-02-20 MED ORDER — ONDANSETRON HCL 4 MG/2ML IJ SOLN
INTRAMUSCULAR | Status: AC
  Filled 2014-02-20: qty 2

## 2014-02-20 MED ORDER — SCOPOLAMINE 1 MG/3DAYS TD PT72
MEDICATED_PATCH | TRANSDERMAL | Status: AC
  Administered 2014-02-20: 1.5 mg via TRANSDERMAL
  Filled 2014-02-20: qty 1

## 2014-02-20 MED ORDER — MIDAZOLAM HCL 2 MG/2ML IJ SOLN
INTRAMUSCULAR | Status: AC
  Filled 2014-02-20: qty 2

## 2014-02-20 MED ORDER — FENTANYL CITRATE 0.05 MG/ML IJ SOLN
INTRAMUSCULAR | Status: AC
  Filled 2014-02-20: qty 5

## 2014-02-20 MED ORDER — LIDOCAINE HCL (CARDIAC) 20 MG/ML IV SOLN
INTRAVENOUS | Status: AC
  Filled 2014-02-20: qty 5

## 2014-02-20 MED ORDER — BUPIVACAINE HCL (PF) 0.25 % IJ SOLN
INTRAMUSCULAR | Status: AC
  Filled 2014-02-20: qty 30

## 2014-02-20 MED ORDER — KETOROLAC TROMETHAMINE 30 MG/ML IJ SOLN
INTRAMUSCULAR | Status: AC
  Filled 2014-02-20: qty 1

## 2014-02-20 MED ORDER — LACTATED RINGERS IV SOLN
INTRAVENOUS | Status: DC
  Administered 2014-02-20: 07:00:00 via INTRAVENOUS

## 2014-02-20 MED ORDER — ACETAMINOPHEN 160 MG/5ML PO SOLN
ORAL | Status: AC
  Administered 2014-02-20: 975 mg via ORAL
  Filled 2014-02-20: qty 40.6

## 2014-02-20 MED ORDER — ACETAMINOPHEN 160 MG/5ML PO SOLN
975.0000 mg | Freq: Once | ORAL | Status: AC
  Administered 2014-02-20: 975 mg via ORAL

## 2014-02-20 MED ORDER — HEPARIN SODIUM (PORCINE) 5000 UNIT/ML IJ SOLN
INTRAMUSCULAR | Status: AC
  Filled 2014-02-20: qty 1

## 2014-02-20 MED ORDER — SCOPOLAMINE 1 MG/3DAYS TD PT72
1.0000 | MEDICATED_PATCH | Freq: Once | TRANSDERMAL | Status: DC
  Administered 2014-02-20: 1.5 mg via TRANSDERMAL

## 2014-02-20 MED ORDER — PROPOFOL 10 MG/ML IV EMUL
INTRAVENOUS | Status: AC
  Filled 2014-02-20: qty 20

## 2014-02-20 MED ORDER — NEOSTIGMINE METHYLSULFATE 10 MG/10ML IV SOLN
INTRAVENOUS | Status: AC
  Filled 2014-02-20: qty 1

## 2014-02-20 MED ORDER — ROCURONIUM BROMIDE 100 MG/10ML IV SOLN
INTRAVENOUS | Status: AC
  Filled 2014-02-20: qty 1

## 2014-02-20 MED ORDER — GLYCOPYRROLATE 0.2 MG/ML IJ SOLN
INTRAMUSCULAR | Status: AC
  Filled 2014-02-20: qty 3

## 2014-02-20 SURGICAL SUPPLY — 32 items
BLADE SURG 11 STRL SS (BLADE) IMPLANT
CABLE HIGH FREQUENCY MONO STRZ (ELECTRODE) IMPLANT
CATH ROBINSON RED A/P 16FR (CATHETERS) IMPLANT
CHLORAPREP W/TINT 26ML (MISCELLANEOUS) IMPLANT
CLOTH BEACON ORANGE TIMEOUT ST (SAFETY) IMPLANT
DECANTER SPIKE VIAL GLASS SM (MISCELLANEOUS) IMPLANT
DERMABOND ADVANCED (GAUZE/BANDAGES/DRESSINGS)
DERMABOND ADVANCED .7 DNX12 (GAUZE/BANDAGES/DRESSINGS) IMPLANT
DRSG COVADERM PLUS 2X2 (GAUZE/BANDAGES/DRESSINGS) IMPLANT
DRSG OPSITE POSTOP 3X4 (GAUZE/BANDAGES/DRESSINGS) IMPLANT
GLOVE BIO SURGEON STRL SZ8 (GLOVE) IMPLANT
GLOVE ORTHO TXT STRL SZ7.5 (GLOVE) IMPLANT
GOWN STRL REUS W/TWL 2XL LVL3 (GOWN DISPOSABLE) IMPLANT
GOWN STRL REUS W/TWL LRG LVL3 (GOWN DISPOSABLE) IMPLANT
NEEDLE EPID 17G 5 ECHO TUOHY (NEEDLE) IMPLANT
NEEDLE INSUFFLATION 120MM (ENDOMECHANICALS) IMPLANT
NS IRRIG 1000ML POUR BTL (IV SOLUTION) IMPLANT
PACK LAPAROSCOPY BASIN (CUSTOM PROCEDURE TRAY) IMPLANT
POUCH SPECIMEN RETRIEVAL 10MM (ENDOMECHANICALS) IMPLANT
PROTECTOR NERVE ULNAR (MISCELLANEOUS) IMPLANT
SET IRRIG TUBING LAPAROSCOPIC (IRRIGATION / IRRIGATOR) IMPLANT
SHEARS HARMONIC ACE PLUS 36CM (ENDOMECHANICALS) IMPLANT
SOLUTION ELECTROLUBE (MISCELLANEOUS) IMPLANT
SUT VICRYL 0 UR6 27IN ABS (SUTURE) IMPLANT
SUT VICRYL 4-0 PS2 18IN ABS (SUTURE) IMPLANT
TOWEL OR 17X24 6PK STRL BLUE (TOWEL DISPOSABLE) IMPLANT
TRAY FOLEY CATH 14FR (SET/KITS/TRAYS/PACK) IMPLANT
TROCAR XCEL NON-BLD 11X100MML (ENDOMECHANICALS) IMPLANT
TROCAR XCEL NON-BLD 5MMX100MML (ENDOMECHANICALS) IMPLANT
TROCAR XCEL OPT SLVE 5M 100M (ENDOMECHANICALS) IMPLANT
WARMER LAPAROSCOPE (MISCELLANEOUS) IMPLANT
WATER STERILE IRR 1000ML POUR (IV SOLUTION) IMPLANT

## 2014-02-20 NOTE — Anesthesia Preprocedure Evaluation (Signed)
Anesthesia Evaluation  Patient identified by MRN, date of birth, ID band Patient awake    Reviewed: Allergy & Precautions, H&P , NPO status , Patient's Chart, lab work & pertinent test results, reviewed documented beta blocker date and time   History of Anesthesia Complications (+) POST - OP SPINAL HEADACHE  Airway Mallampati: I TM Distance: >3 FB Neck ROM: full    Dental  (+) Teeth Intact   Pulmonary asthma (rarely uses rescue inhaler (if sick), uses advair daily) ,  breath sounds clear to auscultation  Pulmonary exam normal       Cardiovascular Exercise Tolerance: Good negative cardio ROS  Rhythm:regular Rate:Normal     Neuro/Psych negative neurological ROS  negative psych ROS   GI/Hepatic negative GI ROS, Neg liver ROS,   Endo/Other  negative endocrine ROS  Renal/GU negative Renal ROS     Musculoskeletal   Abdominal   Peds  Hematology negative hematology ROS (+)   Anesthesia Other Findings   Reproductive/Obstetrics negative OB ROS                           Anesthesia Physical Anesthesia Plan  ASA: II  Anesthesia Plan: General ETT   Post-op Pain Management:    Induction:   Airway Management Planned:   Additional Equipment:   Intra-op Plan:   Post-operative Plan:   Informed Consent: I have reviewed the patients History and Physical, chart, labs and discussed the procedure including the risks, benefits and alternatives for the proposed anesthesia with the patient or authorized representative who has indicated his/her understanding and acceptance.   Dental Advisory Given  Plan Discussed with: CRNA and Surgeon  Anesthesia Plan Comments:         Anesthesia Quick Evaluation

## 2014-02-20 NOTE — Progress Notes (Signed)
Patient has decided she is not sure she wants permanent sterility. Discussed other options, she wants to try Paragard. Will cancel surgery, she is to call the office to schedule Paragard.

## 2016-04-18 ENCOUNTER — Ambulatory Visit (INDEPENDENT_AMBULATORY_CARE_PROVIDER_SITE_OTHER): Payer: BC Managed Care – PPO | Admitting: Allergy and Immunology

## 2016-04-18 ENCOUNTER — Encounter: Payer: Self-pay | Admitting: Allergy and Immunology

## 2016-04-18 VITALS — BP 132/90 | HR 72 | Temp 98.5°F | Resp 16 | Ht 68.98 in | Wt 193.6 lb

## 2016-04-18 DIAGNOSIS — H1010 Acute atopic conjunctivitis, unspecified eye: Secondary | ICD-10-CM

## 2016-04-18 DIAGNOSIS — J4520 Mild intermittent asthma, uncomplicated: Secondary | ICD-10-CM | POA: Diagnosis not present

## 2016-04-18 DIAGNOSIS — Z91018 Allergy to other foods: Secondary | ICD-10-CM

## 2016-04-18 DIAGNOSIS — J309 Allergic rhinitis, unspecified: Secondary | ICD-10-CM

## 2016-04-18 DIAGNOSIS — L500 Allergic urticaria: Secondary | ICD-10-CM

## 2016-04-18 MED ORDER — ZAFIRLUKAST 20 MG PO TABS: 20.0000 mg | ORAL_TABLET | Freq: Two times a day (BID) | ORAL | 5 refills | Status: DC

## 2016-04-18 MED ORDER — OMEPRAZOLE 20 MG PO CPDR: 20.0000 mg | DELAYED_RELEASE_CAPSULE | Freq: Every day | ORAL | 5 refills | Status: DC

## 2016-04-18 MED ORDER — AUVI-Q 0.3 MG/0.3ML IJ SOAJ: INTRAMUSCULAR | 3 refills | Status: AC

## 2016-04-18 NOTE — Progress Notes (Signed)
Dear Dr. Martha Clan,  Thank you for referring Martha Hubbard to the [facility] on 04/18/2016.   Below is a summation of this patient's evaluation and recommendations.  Thank you for your referral. I will keep you informed about this patient's response to treatment.   If you have any questions please to do hesitate to contact me.   Sincerely,  Jessica Priest, MD [place]   ______________________________________________________________________    NEW PATIENT NOTE  Referring Provider: Everlean Cherry, MD Primary Provider: Ross Ludwig, NP Date of office visit: 04/18/2016    Subjective:   Chief Complaint:  Martha Hubbard (DOB: 12/03/1980) is a 35 y.o. female who presents to the clinic on 04/18/2016 with a chief complaint of Asthma and Allergic Rhinitis  .     HPI: Martha Hubbard presents to this clinic in evaluation of multiorgan atopic disease symptoms.  She has a long history dating back to childhood of problems with itchy red watery eyes, nasal congestion, and sneezing following exposure to dust and locating to the outdoors. Her symptoms occur on a perennial basis but definitely flare during the spring and fall. This is still an active issue while consistently using Zyrtec and Flonase. She does not have any associated anosmia or decreased ability to taste or ugly nasal discharge but does have intermittent headaches averaging once a month or so.  She also has a history of very intermittent problems with coughing and occasional chest tightness especially if she is exposed to cold air. Exercise does not appear to precipitate this issue. If her allergies get really bad involving her upper airways she will sometimes developing cough. She did have asthma as a child but for the most part this has improved tremendously and she no longer uses a controller agent on a regular basis. She was given Advair  last year but she did not use this but just a few days. She has been given a rescue inhaler last year but she rarely uses this medication.  She has a food allergy. At the age of 65 after eating banana she developed chest pain and chest tightness. Now if she is exposed to banana by tactile exposure she will develop skin tingling at those areas. As well, if she has a strong smell of banana she'll develop eye itching and some shortness of breath. In addition, when she has eaten walnuts in the past she's developed mouth itching and shortness of breath. She's been without walnut consumption for 20 years. When eating melons she gets nausea and mouth itching.  She also has a problem with intermittent urticaria of unknown trigger. This will occur several times per week and is not associated with any systemic or constitutional symptoms. Her lesions never heal with scar or hyperpigmentation and a last several hours. If she takes an antihistamine this helps significantly.  Finally, she has problems with constant postnasal drip regardless of her upper airway allergic activity. She has throat clearing and raspy voice. She does have burning in her chest and nausea when she gets lots of drainage. She drinks caffeine twice a week and eats chocolate once a week.  Past Medical History:  Diagnosis Date  . Asthma    rare emergency inhaler use  . Spinal headache 2012   s/p cerclage    Past Surgical History:  Procedure Laterality Date  . CERVICAL CERCLAGE    . LEEP    .  TONSILLECTOMY        Medication List      ADVAIR DISKUS 250-50 MCG/DOSE Aepb Generic drug:  Fluticasone-Salmeterol Inhale 1 puff into the lungs 2 (two) times daily.   cetirizine 10 MG tablet Commonly known as:  ZYRTEC Take 10 mg by mouth daily.   FLONASE 50 MCG/ACT nasal spray Generic drug:  fluticasone Place 1 spray into both nostrils daily.   multivitamin tablet Take 1 tablet by mouth daily.   PROAIR HFA 108 (90 Base) MCG/ACT  inhaler Generic drug:  albuterol Inhale two puffs every four to six hour as needed for cough or wheeze.       Allergies  Allergen Reactions  . Montelukast Sodium Itching    Review of systems negative except as noted in HPI / PMHx or noted below:  Review of Systems  Constitutional: Negative.   HENT: Negative.   Eyes: Negative.   Respiratory: Negative.   Cardiovascular: Negative.   Gastrointestinal: Negative.   Genitourinary: Negative.   Musculoskeletal: Negative.   Skin: Negative.   Neurological: Negative.   Endo/Heme/Allergies: Negative.   Psychiatric/Behavioral: Negative.     Family History  Problem Relation Age of Onset  . High blood pressure Mother   . Diabetes Father   . High blood pressure Father   . Asthma Father   . Heart attack Father   . Stroke Father   . High blood pressure Maternal Grandmother   . Thyroid cancer Maternal Grandmother   . Heart attack Maternal Grandmother   . Emphysema Maternal Grandfather   . Diabetes Paternal Grandmother   . High blood pressure Paternal Grandmother   . Asthma Son     Social History   Social History  . Marital status: Single    Spouse name: N/A  . Number of children: N/A  . Years of education: N/A   Occupational History  . Not on file.   Social History Main Topics  . Smoking status: Never Smoker  . Smokeless tobacco: Never Used  . Alcohol use No  . Drug use: No  . Sexual activity: Not on file   Other Topics Concern  . Not on file   Social History Narrative  . No narrative on file    Environmental and Social history  Lives in a house with a dry environment, no animals located inside the household, carpeting in the bedroom, no plastic on the bed or pillow, and no smokers located inside the household. 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  Objective:   Vitals:   04/18/16 0921  BP: 132/90  Pulse: 72  Resp: 16  Temp: 98.5 F (36.9 C)   Height: 5' 8.98" (175.2 cm) Weight: 193 lb 9.6 oz (87.8 kg)  Physical  Exam  Constitutional: She is well-developed, well-nourished, and in no distress.  HENT:  Head: Normocephalic. Head is without right periorbital erythema and without left periorbital erythema.  Right Ear: Tympanic membrane, external ear and ear canal normal.  Left Ear: Tympanic membrane, external ear and ear canal normal.  Nose: Nose normal. No mucosal edema or rhinorrhea.  Mouth/Throat: Oropharynx is clear and moist and mucous membranes are normal. No oropharyngeal exudate.  Eyes: Conjunctivae and lids are normal. Pupils are equal, round, and reactive to light.  Neck: Trachea normal. No tracheal deviation present. No thyromegaly present.  Cardiovascular: Normal rate, regular rhythm, S1 normal, S2 normal and normal heart sounds.   No murmur heard. Pulmonary/Chest: Effort normal. No stridor. No tachypnea. No respiratory distress. She has no wheezes. She  has no rales. She exhibits no tenderness.  Abdominal: Soft. She exhibits no distension and no mass. There is no hepatosplenomegaly. There is no tenderness. There is no rebound and no guarding.  Musculoskeletal: She exhibits no edema or tenderness.  Lymphadenopathy:       Head (right side): No tonsillar adenopathy present.       Head (left side): No tonsillar adenopathy present.    She has no cervical adenopathy.    She has no axillary adenopathy.  Neurological: She is alert. Gait normal.  Skin: No rash noted. She is not diaphoretic. No erythema. No pallor. Nails show no clubbing.  Psychiatric: Mood and affect normal.    Diagnostics: Allergy skin tests were performed. She demonstrated severe hypersensitivity against various aeroallergens including house dust mite, dog, grasses, weeds, trees, and molds. She also demonstrated hypersensitivity against various foods although a relatively low reactivity including peanut, soybean, corn, strawberry, walnut, and almond.  Spirometry was performed and demonstrated an FEV1 of 2.59 @ 84 % of  predicted.  Assessment and Plan:    1. Allergic rhinoconjunctivitis   2. Asthma, mild intermittent, well-controlled   3. Food allergy   4. Allergic urticaria     1. Allergen avoidance measures  2. Treat and prevent inflammation:   A. Accolate 20 mg tablet twice a day  B. Flonase or Rhinocort 1-2 sprays each nostril one time per day  3. If needed:   A. Zyrtec 10 mg one tablet 1-2 times a day  B. Auvi-Q 3.0, Benadryl, M.D./ER for allergic reaction  C. Proair HFA 2 puffs every 4-6 hours  4. Treat reflux:   A. avoid caffeine and chocolate consumption  B. omeprazole 20 mg one tablet once a day  5. Immunotherapy?  6. Obtain fall flu vaccine  7. Return to clinic in 4 weeks or earlier if problem  Martha Hubbard has rather significant multiorgan atopic disease and I suspect that her recurrent urticarial reactions are probably a manifestation of his atopic disease. Certainly her respiratory tract symptoms are quite active as a result of all of her allergen exposures. I'm going to try and get her to perform allergen avoidance measures as best as possible and use anti-inflammatory medications as noted above but in the long run I think that she should consider a course of immunotherapy and I've given her literature on this form of treatment during today's visit. I also gave her an injectable epinephrine device given her food allergies and asked her to remain away from banana and walnut and melons and to also remain away from any food that gives rise to significant mouth itching. She does appear to have some reflux and were going to treat that with low-dose omeprazole. I'll see her back in this clinic in 4 weeks or earlier if there is a problem.  Jessica PriestEric J. Jerelene Salaam, MD [facility] [place]

## 2016-04-18 NOTE — Patient Instructions (Addendum)
  1. Allergen avoidance measures  2. Treat and prevent inflammation:   A. Accolate 20 mg tablet twice a day  B. Flonase or Rhinocort 1-2 sprays each nostril one time per day  3. If needed:   A. Zyrtec 10 mg one tablet 1-2 times a day  B. Auvi-Q 3.0, Benadryl, M.D./ER for allergic reaction  C. Proair HFA 2 puffs every 4-6 hours  4. Treat reflux:   A. avoid caffeine and chocolate consumption  B. omeprazole 20 mg one tablet once a day  5. Immunotherapy?  6. Obtain fall flu vaccine  7. Return to clinic in 4 weeks or earlier if problem

## 2016-05-16 ENCOUNTER — Ambulatory Visit: Payer: BC Managed Care – PPO | Admitting: Allergy and Immunology

## 2020-09-17 ENCOUNTER — Ambulatory Visit (INDEPENDENT_AMBULATORY_CARE_PROVIDER_SITE_OTHER): Payer: PRIVATE HEALTH INSURANCE | Admitting: Physician Assistant

## 2020-09-17 ENCOUNTER — Ambulatory Visit: Payer: Self-pay | Admitting: Physician Assistant

## 2020-09-17 ENCOUNTER — Other Ambulatory Visit: Payer: Self-pay

## 2020-09-17 ENCOUNTER — Other Ambulatory Visit: Payer: Self-pay | Admitting: Physician Assistant

## 2020-09-17 ENCOUNTER — Encounter: Payer: Self-pay | Admitting: Physician Assistant

## 2020-09-17 VITALS — BP 112/84 | HR 79 | Temp 97.5°F | Ht 69.5 in | Wt 197.6 lb

## 2020-09-17 DIAGNOSIS — J3089 Other allergic rhinitis: Secondary | ICD-10-CM

## 2020-09-17 DIAGNOSIS — R0789 Other chest pain: Secondary | ICD-10-CM | POA: Insufficient documentation

## 2020-09-17 DIAGNOSIS — Z1231 Encounter for screening mammogram for malignant neoplasm of breast: Secondary | ICD-10-CM

## 2020-09-17 DIAGNOSIS — K219 Gastro-esophageal reflux disease without esophagitis: Secondary | ICD-10-CM | POA: Insufficient documentation

## 2020-09-17 DIAGNOSIS — R5381 Other malaise: Secondary | ICD-10-CM

## 2020-09-17 DIAGNOSIS — J309 Allergic rhinitis, unspecified: Secondary | ICD-10-CM | POA: Insufficient documentation

## 2020-09-17 DIAGNOSIS — F418 Other specified anxiety disorders: Secondary | ICD-10-CM | POA: Diagnosis not present

## 2020-09-17 MED ORDER — PANTOPRAZOLE SODIUM 40 MG PO TBEC: 40.0000 mg | DELAYED_RELEASE_TABLET | Freq: Every day | ORAL | 3 refills | Status: DC

## 2020-09-17 MED ORDER — ALBUTEROL SULFATE HFA 108 (90 BASE) MCG/ACT IN AERS: 2.0000 | INHALATION_SPRAY | RESPIRATORY_TRACT | 3 refills | Status: AC | PRN

## 2020-09-17 MED ORDER — FLUTICASONE PROPIONATE 50 MCG/ACT NA SUSP: 2.0000 | Freq: Every day | NASAL | 3 refills | Status: AC

## 2020-09-17 MED ORDER — CETIRIZINE HCL 10 MG PO TABS: 10.0000 mg | ORAL_TABLET | Freq: Every day | ORAL | 5 refills | Status: AC

## 2020-09-17 MED ORDER — CITALOPRAM HYDROBROMIDE 10 MG PO TABS: 10.0000 mg | ORAL_TABLET | Freq: Every day | ORAL | 3 refills | Status: DC

## 2020-09-17 NOTE — Progress Notes (Signed)
Subjective:  Patient ID: Martha Hubbard, female    DOB: Jun 28, 1980  Age: 40 y.o. MRN: [id number]  Chief Complaint  Patient presents with  . Chest Pain    HPI  pt in today with complaints of chest discomfort -- of note pt not seen in last 2 years She feels as though she has something stuck in her throat - it is not with swallowing or with eating/ drinking -- states she feels it mostly when she is having increased anxiety She denies shortness of breath  Pt states she has a history of anxiety/depression in past and had taken meds but not in several years -- she feels as though she would benefit from restarting med She worries a lot and has had several deaths in her family over the past year including her mother 2 months ago States she has felt down and more tired than usual  Pt with history of GERD - had taken omeprazole in the past but not taking regularly - unsure if it has helped her chest/swallowing discomfort  Pt with history of allergies and requests refill of her meds - albuterol, zyrtec and flonase  Current Outpatient Medications on File Prior to Visit  Medication Sig Dispense Refill  . AUVI-Q 0.3 MG/0.3ML SOAJ injection Use as directed for life-threatening allergic reaction. 4 Device 3  . Multiple Vitamin (MULTIVITAMIN) tablet Take 1 tablet by mouth daily.     No current facility-administered medications on file prior to visit.   Past Medical History:  Diagnosis Date  . Asthma    rare emergency inhaler use  . Spinal headache 2012   s/p cerclage   Past Surgical History:  Procedure Laterality Date  . CERVICAL CERCLAGE    . LEEP    . TONSILLECTOMY      Family History  Problem Relation Age of Onset  . High blood pressure Mother   . Diabetes Father   . High blood pressure Father   . Asthma Father   . Heart attack Father   . Stroke Father   . High blood pressure Maternal Grandmother   . Thyroid cancer Maternal Grandmother   . Heart attack Maternal Grandmother   .  Emphysema Maternal Grandfather   . Diabetes Paternal Grandmother   . High blood pressure Paternal Grandmother   . Asthma Son    Social History   Socioeconomic History  . Marital status: Single    Spouse name: Not on file  . Number of children: Not on file  . Years of education: Not on file  . Highest education level: Not on file  Occupational History  . Not on file  Tobacco Use  . Smoking status: Never Smoker  . Smokeless tobacco: Never Used  Substance and Sexual Activity  . Alcohol use: No  . Drug use: No  . Sexual activity: Not on file  Other Topics Concern  . Not on file  Social History Narrative  . Not on file   Social Determinants of Health   Financial Resource Strain: Not on file  Food Insecurity: Not on file  Transportation Needs: Not on file  Physical Activity: Not on file  Stress: Not on file  Social Connections: Not on file    Review of Systems CONSTITUTIONAL: see HPI E/N/T: Negative for ear pain, nasal congestion and sore throat.  CARDIOVASCULAR: Negative for chest pain, dizziness, palpitations and pedal edema.  RESPIRATORY: Negative for recent cough and dyspnea.  GASTROINTESTINAL: see HPI MSK: Negative for arthralgias and myalgias.  INTEGUMENTARY: Negative for rash.  NEUROLOGICAL: Negative for dizziness and headaches.  PSYCHIATRIC: see HPI      Objective:  BP 112/84 (BP Location: Left Arm, Patient Position: Sitting, Cuff Size: Normal)   Pulse 79   Temp (!) 97.5 F (36.4 C) (Temporal)   Ht 5' 9.5" (1.765 m)   Wt 197 lb 9.6 oz (89.6 kg)   SpO2 96%   BMI 28.76 kg/m   BP/Weight 09/17/2020 04/18/2016 02/20/2014  Systolic BP 112 132 125  Diastolic BP 84 90 75  Wt. (Lbs) 197.6 193.6 185  BMI 28.76 28.61 27.31    Physical Exam PHYSICAL EXAM:   VS: BP 112/84 (BP Location: Left Arm, Patient Position: Sitting, Cuff Size: Normal)   Pulse 79   Temp (!) 97.5 F (36.4 C) (Temporal)   Ht 5' 9.5" (1.765 m)   Wt 197 lb 9.6 oz (89.6 kg)   SpO2 96%    BMI 28.76 kg/m   GEN: Well nourished, well developed, in no acute distress  Cardiac: RRR; no murmurs, rubs, or gallops,no edema -  Respiratory:  normal respiratory rate and pattern with no distress - normal breath sounds with no rales, rhonchi, wheezes or rubs GI: normal bowel sounds, no masses or tenderness MS: no deformity or atrophy  Skin: warm and dry, no rash  Psych: euthymic mood, appropriate affect and demeanor  EKG - normal Diabetic Foot Exam - Simple   No data filed      Lab Results  Component Value Date   WBC 7.3 02/20/2014   HGB 12.8 02/20/2014   HCT 37.6 02/20/2014   PLT 210 02/20/2014      Assessment & Plan:   1. Other chest pain - probable due to GERD and/or her anxiety - EKG 12-Lead Follow up if symptoms change or worsen 2. Malaise - CBC with Differential/Platelet - Comprehensive metabolic panel - TSH  3. Depression with anxiety labwork pending rx for celexa 10mg  qd 4. Seasonal allergic rhinitis due to other allergic trigger Refill of meds given 5. Gastroesophageal reflux disease without esophagitis  stop omeprazole and rx for protonix qd  Meds ordered this encounter  Medications  . albuterol (VENTOLIN HFA) 108 (90 Base) MCG/ACT inhaler    Sig: Inhale 2 puffs into the lungs every 4 (four) hours as needed for wheezing or shortness of breath. Inhale two puffs every four to six hour as needed for cough or wheeze.    Dispense:  8 g    Refill:  3    Order Specific Question:   Supervising Provider    Answer Blane Ohara  . cetirizine (ZYRTEC) 10 MG tablet    Sig: Take 1 tablet (10 mg total) by mouth daily.    Dispense:  30 tablet    Refill:  5    Order Specific Question:   Supervising Provider    [id number] Blane Ohara  . fluticasone (FLONASE) 50 MCG/ACT nasal spray    Sig: Place 2 sprays into both nostrils daily.    Dispense:  15.8 mL    Refill:  3    Order Specific Question:   Supervising Provider    [id number] Blane Ohara  . citalopram (CELEXA) 10 MG tablet    Sig: Take 1 tablet (10 mg total) by mouth daily.    Dispense:  30 tablet    Refill:  3    Order Specific Question:   Supervising Provider    Answer:   COX,  KIRSTEN [[id number]]  . pantoprazole (PROTONIX) 40 MG tablet    Sig: Take 1 tablet (40 mg total) by mouth daily.    Dispense:  30 tablet    Refill:  3    Order Specific Question:   Supervising Provider    AnswerCorey Harold    Orders Placed This Encounter  Procedures  . CBC with Differential/Platelet  . Comprehensive metabolic panel  . TSH  . EKG 12-Lead      Follow-up: Return in about 4 weeks (around 10/15/2020).  An After Visit Summary was printed and given to the patient.  Jettie Pagan [facility] [phone]

## 2020-09-18 LAB — CBC WITH DIFFERENTIAL/PLATELET
Basophils Absolute: 0.1 10*3/uL (ref 0.0–0.2)
Basos: 1 %
EOS (ABSOLUTE): 0.3 10*3/uL (ref 0.0–0.4)
Eos: 6 %
Hematocrit: 36.5 % (ref 34.0–46.6)
Hemoglobin: 11.7 g/dL (ref 11.1–15.9)
Immature Grans (Abs): 0 10*3/uL (ref 0.0–0.1)
Immature Granulocytes: 0 %
Lymphocytes Absolute: 2.5 10*3/uL (ref 0.7–3.1)
Lymphs: 47 %
MCH: 31.5 pg (ref 26.6–33.0)
MCHC: 32.1 g/dL (ref 31.5–35.7)
MCV: 98 fL — ABNORMAL HIGH (ref 79–97)
Monocytes Absolute: 0.3 10*3/uL (ref 0.1–0.9)
Monocytes: 6 %
Neutrophils Absolute: 2.1 10*3/uL (ref 1.4–7.0)
Neutrophils: 40 %
Platelets: 223 10*3/uL (ref 150–450)
RBC: 3.72 x10E6/uL — ABNORMAL LOW (ref 3.77–5.28)
RDW: 11.9 % (ref 11.7–15.4)
WBC: 5.4 10*3/uL (ref 3.4–10.8)

## 2020-09-18 LAB — COMPREHENSIVE METABOLIC PANEL
ALT: 13 IU/L (ref 0–32)
AST: 18 IU/L (ref 0–40)
Albumin/Globulin Ratio: 1.4 (ref 1.2–2.2)
Albumin: 3.7 g/dL — ABNORMAL LOW (ref 3.8–4.8)
Alkaline Phosphatase: 50 IU/L (ref 44–121)
BUN/Creatinine Ratio: 9 (ref 9–23)
BUN: 8 mg/dL (ref 6–24)
Bilirubin Total: 0.4 mg/dL (ref 0.0–1.2)
CO2: 24 mmol/L (ref 20–29)
Calcium: 8.5 mg/dL — ABNORMAL LOW (ref 8.7–10.2)
Chloride: 104 mmol/L (ref 96–106)
Creatinine, Ser: 0.92 mg/dL (ref 0.57–1.00)
Globulin, Total: 2.6 g/dL (ref 1.5–4.5)
Glucose: 86 mg/dL (ref 65–99)
Potassium: 4.3 mmol/L (ref 3.5–5.2)
Sodium: 139 mmol/L (ref 134–144)
Total Protein: 6.3 g/dL (ref 6.0–8.5)
eGFR: 80 mL/min/{1.73_m2} (ref >59)

## 2020-09-18 LAB — TSH: TSH: 2.12 u[IU]/mL (ref 0.450–4.500)

## 2020-10-09 ENCOUNTER — Other Ambulatory Visit: Payer: Self-pay | Admitting: Physician Assistant

## 2020-10-09 DIAGNOSIS — F418 Other specified anxiety disorders: Secondary | ICD-10-CM

## 2020-10-12 ENCOUNTER — Ambulatory Visit: Payer: PRIVATE HEALTH INSURANCE | Admitting: Physician Assistant

## 2020-10-15 ENCOUNTER — Ambulatory Visit: Payer: PRIVATE HEALTH INSURANCE | Admitting: Physician Assistant

## 2020-11-10 ENCOUNTER — Other Ambulatory Visit: Payer: Self-pay | Admitting: Physician Assistant

## 2020-11-10 DIAGNOSIS — F418 Other specified anxiety disorders: Secondary | ICD-10-CM

## 2020-11-30 ENCOUNTER — Other Ambulatory Visit: Payer: Self-pay | Admitting: Physician Assistant

## 2020-11-30 DIAGNOSIS — K219 Gastro-esophageal reflux disease without esophagitis: Secondary | ICD-10-CM

## 2021-01-21 ENCOUNTER — Other Ambulatory Visit: Payer: Self-pay | Admitting: Physician Assistant

## 2021-01-21 DIAGNOSIS — K219 Gastro-esophageal reflux disease without esophagitis: Secondary | ICD-10-CM

## 2021-01-21 DIAGNOSIS — F418 Other specified anxiety disorders: Secondary | ICD-10-CM

## 2021-01-22 ENCOUNTER — Other Ambulatory Visit: Payer: Self-pay

## 2021-01-22 MED ORDER — PANTOPRAZOLE SODIUM 40 MG PO TBEC
40.0000 mg | DELAYED_RELEASE_TABLET | Freq: Every day | ORAL | 0 refills | Status: AC
  Filled 2021-01-22: qty 30, 30d supply, fill #0

## 2021-01-22 MED ORDER — CITALOPRAM HYDROBROMIDE 10 MG PO TABS
10.0000 mg | ORAL_TABLET | Freq: Every day | ORAL | 0 refills | Status: DC
  Filled 2021-01-22: qty 30, 30d supply, fill #0

## 2021-01-25 ENCOUNTER — Other Ambulatory Visit: Payer: Self-pay

## 2021-01-26 ENCOUNTER — Other Ambulatory Visit: Payer: Self-pay

## 2021-02-28 ENCOUNTER — Other Ambulatory Visit: Payer: Self-pay | Admitting: Physician Assistant

## 2021-02-28 DIAGNOSIS — F418 Other specified anxiety disorders: Secondary | ICD-10-CM
